# Patient Record
Sex: Female | Born: 1995 | Race: Black or African American | Hispanic: No | Marital: Single | Smoking: Never smoker
Health system: Southern US, Community
[De-identification: ages and names within clinical notes are randomized; demographics above are authoritative.]

## PROBLEM LIST (undated history)

## (undated) ENCOUNTER — Inpatient Hospital Stay (HOSPITAL_COMMUNITY): Payer: Self-pay

## (undated) DIAGNOSIS — Z789 Other specified health status: Secondary | ICD-10-CM

## (undated) DIAGNOSIS — I10 Essential (primary) hypertension: Secondary | ICD-10-CM

## (undated) DIAGNOSIS — R87619 Unspecified abnormal cytological findings in specimens from cervix uteri: Secondary | ICD-10-CM

## (undated) DIAGNOSIS — R87629 Unspecified abnormal cytological findings in specimens from vagina: Secondary | ICD-10-CM

## (undated) DIAGNOSIS — O1410 Severe pre-eclampsia, unspecified trimester: Secondary | ICD-10-CM

## (undated) DIAGNOSIS — J45909 Unspecified asthma, uncomplicated: Secondary | ICD-10-CM

## (undated) DIAGNOSIS — R519 Headache, unspecified: Secondary | ICD-10-CM

## (undated) HISTORY — DX: Unspecified asthma, uncomplicated: J45.909

## (undated) HISTORY — DX: Unspecified abnormal cytological findings in specimens from vagina: R87.629

## (undated) HISTORY — DX: Severe pre-eclampsia, unspecified trimester: O14.10

## (undated) HISTORY — DX: Headache, unspecified: R51.9

## (undated) HISTORY — DX: Unspecified abnormal cytological findings in specimens from cervix uteri: R87.619

## (undated) HISTORY — PX: NO PAST SURGERIES: SHX2092

---
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
## 2016-01-05 ENCOUNTER — Encounter (HOSPITAL_COMMUNITY): Payer: Self-pay | Admitting: *Deleted

## 2016-01-05 ENCOUNTER — Inpatient Hospital Stay (HOSPITAL_COMMUNITY)
Admission: AD | Admit: 2016-01-05 | Discharge: 2016-01-05 | Disposition: A | Discharge status: Home / Self Care | Payer: Medicaid Other | Source: Ambulatory Visit | Attending: Family Medicine | Admitting: Family Medicine

## 2016-01-05 DIAGNOSIS — O9989 Other specified diseases and conditions complicating pregnancy, childbirth and the puerperium: Secondary | ICD-10-CM

## 2016-01-05 DIAGNOSIS — R1084 Generalized abdominal pain: Secondary | ICD-10-CM | POA: Insufficient documentation

## 2016-01-05 DIAGNOSIS — R109 Unspecified abdominal pain: Secondary | ICD-10-CM | POA: Diagnosis present

## 2016-01-05 DIAGNOSIS — Z3A13 13 weeks gestation of pregnancy: Secondary | ICD-10-CM | POA: Diagnosis not present

## 2016-01-05 DIAGNOSIS — O26891 Other specified pregnancy related conditions, first trimester: Secondary | ICD-10-CM | POA: Insufficient documentation

## 2016-01-05 DIAGNOSIS — O26899 Other specified pregnancy related conditions, unspecified trimester: Secondary | ICD-10-CM

## 2016-01-05 LAB — URINALYSIS, ROUTINE W REFLEX MICROSCOPIC
BILIRUBIN URINE: NEGATIVE
Glucose, UA: NEGATIVE mg/dL
HGB URINE DIPSTICK: NEGATIVE
KETONES UR: NEGATIVE mg/dL
Nitrite: NEGATIVE
PH: 8.5 — AB (ref 5.0–8.0)
Protein, ur: NEGATIVE mg/dL
SPECIFIC GRAVITY, URINE: 1.02 (ref 1.005–1.030)

## 2016-01-05 LAB — WET PREP, GENITAL
Sperm: NONE SEEN
TRICH WET PREP: NONE SEEN
YEAST WET PREP: NONE SEEN

## 2016-01-05 LAB — OB RESULTS CONSOLE GC/CHLAMYDIA
Chlamydia: NEGATIVE
Gonorrhea: NEGATIVE

## 2016-01-05 LAB — URINE MICROSCOPIC-ADD ON: RBC / HPF: NONE SEEN RBC/hpf (ref 0–5)

## 2016-01-05 NOTE — Discharge Instructions (Signed)
Take tylenol as needed for pain. Keep your appointment as scheduled for prenatal care. Return here as needed.

## 2016-01-05 NOTE — MAU Provider Note (Signed)
CSN: [id number]     Arrival date & time 01/05/16  1159 History   None    Chief Complaint  Patient presents with  . Abdominal Pain     (Consider location/radiation/quality/duration/timing/severity/associated sxs/prior Treatment) Abdominal Pain This is a new problem. The current episode started in the past 7 days. The onset quality is gradual. The problem occurs intermittently. The problem has been unchanged. The pain is located in the generalized abdominal region. The pain is at a severity of 7/10. The quality of the pain is sharp. Associated symptoms include nausea. Pertinent negatives include no constipation, diarrhea, dysuria, fever, frequency, hematuria or vomiting. The pain is relieved by nothing. She has tried nothing for the symptoms.   Deborah Wells is a 20 y.o. G1P0 @ [redacted]w[redacted]d gestation who presents to the MAU with generalized abdominal pain that started 3 days ago. She reports the pain is sharp and comes and goes. She has taken nothing for the pain. Patient denies vaginal bleeding or d/c, she denies UTI symptoms.   Past Medical History  Diagnosis Date  . Medical history non-contributory    Past Surgical History  Procedure Laterality Date  . No past surgeries     Family History  Problem Relation Age of Onset  . Diabetes Mother   . Cancer Maternal Grandmother    Social History  Substance Use Topics  . Smoking status: Never Smoker   . Smokeless tobacco: Never Used  . Alcohol Use: No   OB History    Gravida Para Term Preterm AB TAB SAB Ectopic Multiple Living   1              Review of Systems  Constitutional: Negative for fever and chills.  HENT: Negative.   Eyes: Negative for visual disturbance.  Respiratory: Negative for cough, chest tightness and shortness of breath.   Cardiovascular: Negative for chest pain and palpitations.  Gastrointestinal: Positive for nausea and abdominal pain. Negative for vomiting, diarrhea and constipation.  Genitourinary: Negative  for dysuria, frequency, hematuria, flank pain, decreased urine volume, vaginal bleeding and vaginal discharge.  Musculoskeletal: Negative for back pain and joint swelling.  Skin: Negative for rash.  Psychiatric/Behavioral: Negative for confusion. The patient is not nervous/anxious.       Allergies  Review of patient's allergies indicates no known allergies.  Home Medications   Prior to Admission medications   Medication Sig Start Date End Date Taking? Authorizing Provider  metroNIDAZOLE (FLAGYL) 500 MG tablet Take 1 tablet (500 mg total) by mouth 2 (two) times daily. Patient not taking: Reported on 01/05/2016 11/15/15   Verita Schneiders Key, NP   BP 129/71 mmHg  Pulse 104  Temp(Src) 98.4 F (36.9 C) (Oral)  Resp 18  Wt 117 lb 0.6 oz (53.089 kg)  LMP 10/01/2015 Physical Exam  Constitutional: She is oriented to person, place, and time. She appears well-developed and well-nourished.  HENT:  Head: Normocephalic and atraumatic.  Eyes: Conjunctivae and EOM are normal.  Neck: Normal range of motion. Neck supple.  Cardiovascular: Normal rate and regular rhythm.   Pulmonary/Chest: Effort normal and breath sounds normal.  Abdominal: Soft. Bowel sounds are normal. There is no CVA tenderness.  FHT 150, pain not with palpation just sharp that comes and goes.   Genitourinary:  External genitalia without lesions, white d/c vaginal vault, Cervix long, closed, no CMT, no adnexal tenderness, uterus approximately 13 week size.   Musculoskeletal: Normal range of motion.  Neurological: She is alert and oriented to person, place, and  time. No cranial nerve deficit.  Skin: Skin is warm and dry.  Psychiatric: She has a normal mood and affect. Her behavior is normal.  Nursing note and vitals reviewed.   ED Course  Procedures (including critical care time) Labs Review Labs Reviewed  URINALYSIS, ROUTINE W REFLEX MICROSCOPIC (NOT AT [facility]) - Abnormal; Notable for the following:    APPearance CLOUDY (*)     pH 8.5 (*)    Leukocytes, UA TRACE (*)    All other components within normal limits  URINE MICROSCOPIC-ADD ON - Abnormal; Notable for the following:    Squamous Epithelial / LPF 6-30 (*)    Bacteria, UA FEW (*)    All other components within normal limits  WET PREP, GENITAL  GC/CHLAMYDIA PROBE AMP ([place]) NOT AT [facility]    Imaging Review No results found. I have personally reviewed and evaluated these images and lab results as part of my medical decision-making.   MDM  20 y.o. G1P0 @ 6877w0d gestation with abdominal pain that comes and goes stable for d/c without acute abdomen. Will treat for round ligament pain and she [facility]. She will return to MAU as needed for worsening symptoms. Tylenol for pain.   Final diagnoses:  Abdominal pain in pregnancy

## 2016-01-05 NOTE — MAU Note (Signed)
Sharp pains all over stomach. Started 3 days ago, comes and goes. Denies vomiting, diarrhea or constipation. Denies urinary symptoms.

## 2016-01-05 NOTE — Progress Notes (Signed)
Written and verbal d/c instructions given and understanding voiced. 

## 2016-01-07 LAB — GC/CHLAMYDIA PROBE AMP (~~LOC~~) NOT AT ARMC
Chlamydia: NEGATIVE
NEISSERIA GONORRHEA: NEGATIVE

## 2016-01-21 ENCOUNTER — Ambulatory Visit (INDEPENDENT_AMBULATORY_CARE_PROVIDER_SITE_OTHER): Payer: Medicaid Other | Admitting: Obstetrics & Gynecology

## 2016-01-21 ENCOUNTER — Encounter: Payer: Self-pay | Admitting: Obstetrics & Gynecology

## 2016-01-21 VITALS — BP 91/65 | HR 77 | Wt 117.4 lb

## 2016-01-21 DIAGNOSIS — Z3492 Encounter for supervision of normal pregnancy, unspecified, second trimester: Secondary | ICD-10-CM | POA: Diagnosis present

## 2016-01-21 DIAGNOSIS — Z98891 History of uterine scar from previous surgery: Secondary | ICD-10-CM | POA: Insufficient documentation

## 2016-01-21 DIAGNOSIS — Z3490 Encounter for supervision of normal pregnancy, unspecified, unspecified trimester: Secondary | ICD-10-CM

## 2016-01-21 DIAGNOSIS — Z3402 Encounter for supervision of normal first pregnancy, second trimester: Secondary | ICD-10-CM

## 2016-01-21 LAB — POCT URINALYSIS DIP (DEVICE)
Bilirubin Urine: NEGATIVE
Glucose, UA: NEGATIVE mg/dL
HGB URINE DIPSTICK: NEGATIVE
Ketones, ur: NEGATIVE mg/dL
NITRITE: NEGATIVE
PH: 7 (ref 5.0–8.0)
PROTEIN: NEGATIVE mg/dL
Specific Gravity, Urine: 1.02 (ref 1.005–1.030)
UROBILINOGEN UA: 2 mg/dL — AB (ref 0.0–1.0)

## 2016-01-21 NOTE — Patient Instructions (Signed)

## 2016-01-21 NOTE — Progress Notes (Signed)
   Subjective:    Deborah CroftBrooklyn D Wells is a G1P0 5745w2d being seen today for her first obstetrical visit.  Her obstetrical history is significant for teen pregnancy. Patient does intend to breast feed. Pregnancy history fully reviewed.  Patient reports no complaints.  Filed Vitals:   01/21/16 1559  BP: 91/65  Pulse: 77  Weight: 117 lb 6.4 oz (53.252 kg)    HISTORY: OB History  Gravida Para Term Preterm AB SAB TAB Ectopic Multiple Living  1             # Outcome Date GA Lbr Len/2nd Weight Sex Delivery Anes PTL Lv  1 Current              Past Medical History  Diagnosis Date  . Medical history non-contributory   . Asthma    Past Surgical History  Procedure Laterality Date  . No past surgeries     Family History  Problem Relation Age of Onset  . Diabetes Mother   . Cancer Maternal Grandmother      Exam    Uterus:     Pelvic Exam:    Perineum: No Hemorrhoids   Vulva: normal   Vagina:      pH:     Cervix: no lesions   Adnexa: not evaluated   Bony Pelvis: average  System: Breast:  normal appearance, no masses or tenderness   Skin: normal coloration and turgor, no rashes    Neurologic: oriented, normal mood   Extremities: normal strength, tone, and muscle mass   HEENT PERRLA and sclera clear, anicteric   Mouth/Teeth dental hygiene good   Neck supple   Cardiovascular: regular rate and rhythm   Respiratory:  appears well, vitals normal, no respiratory distress, acyanotic, normal RR   Abdomen: soft, non-tender; bowel sounds normal; no masses,  no organomegaly   Urinary: urethral meatus normal      Assessment:    Pregnancy: G1P0 There are no active problems to display for this patient.       Plan:     Initial labs drawn. Prenatal vitamins. Problem list reviewed and updated. Genetic Screening discussed Quad Screen: requested.  Ultrasound discussed; fetal survey: ordered.  Follow up in 4 weeks. 50% of 30 min visit spent on counseling and coordination  of care.     Frazier Balfour 01/21/2016

## 2016-01-21 NOTE — Progress Notes (Signed)
Here for initial prenatal visit. Urinalysis shows moderate leukocytes. Given new patient education booklets.

## 2016-01-22 LAB — PRENATAL PROFILE (SOLSTAS)
Antibody Screen: NEGATIVE
BASOS PCT: 1 %
Basophils Absolute: 77 cells/uL (ref 0–200)
EOS ABS: 77 {cells}/uL (ref 15–500)
Eosinophils Relative: 1 %
HCT: 35.7 % (ref 35.0–45.0)
HIV: NONREACTIVE
Hemoglobin: 11.9 g/dL (ref 11.7–15.5)
Hepatitis B Surface Ag: NEGATIVE
Lymphocytes Relative: 26 %
Lymphs Abs: 2002 cells/uL (ref 850–3900)
MCH: 28.2 pg (ref 27.0–33.0)
MCHC: 33.3 g/dL (ref 32.0–36.0)
MCV: 84.6 fL (ref 80.0–100.0)
MONO ABS: 693 {cells}/uL (ref 200–950)
MPV: 9.9 fL (ref 7.5–12.5)
Monocytes Relative: 9 %
NEUTROS ABS: 4851 {cells}/uL (ref 1500–7800)
Neutrophils Relative %: 63 %
PLATELETS: 314 10*3/uL (ref 140–400)
RBC: 4.22 MIL/uL (ref 3.80–5.10)
RDW: 13.7 % (ref 11.0–15.0)
RUBELLA: 1.95 {index} — AB (ref <0.90)
Rh Type: POSITIVE
WBC: 7.7 10*3/uL (ref 3.8–10.8)

## 2016-01-22 LAB — AFP, QUAD SCREEN
AFP: 30.6 ng/mL
Curr Gest Age: 15.3 weeks
HCG TOTAL: 49.87 [IU]/mL
INH: 203.5 pg/mL
Interpretation-AFP: NEGATIVE
MOM FOR AFP: 0.93
MOM FOR HCG: 0.92
MOM FOR INH: 1
OPEN SPINA BIFIDA: NEGATIVE
Osb Risk: 1:17000 {titer}
Tri 18 Scr Risk Est: NEGATIVE
UE3 MOM: 0.92
uE3 Value: 0.65 ng/mL

## 2016-01-23 LAB — CULTURE, OB URINE: Colony Count: 75000

## 2016-01-26 LAB — PAIN MGMT, PROFILE 6 CONF W/O MM, U
6 Acetylmorphine: NEGATIVE ng/mL (ref <10)
ALCOHOL METABOLITES: NEGATIVE ng/mL (ref <500)
AMPHETAMINES: NEGATIVE ng/mL (ref <500)
Barbiturates: NEGATIVE ng/mL (ref <300)
Benzodiazepines: NEGATIVE ng/mL (ref <100)
COCAINE METABOLITE: NEGATIVE ng/mL (ref <150)
CREATININE: 147.2 mg/dL (ref >=20.0)
METHADONE METABOLITE: NEGATIVE ng/mL (ref <100)
Marijuana Metabolite: NEGATIVE ng/mL (ref <20)
Opiates: NEGATIVE ng/mL (ref <100)
Oxidant: POSITIVE ug/mL — AB (ref <200)
Oxycodone: NEGATIVE ng/mL (ref <100)
PH: 7.5 (ref 4.5–9.0)
Phencyclidine: NEGATIVE ng/mL (ref <25)
Please note:: 0

## 2016-02-10 ENCOUNTER — Encounter (HOSPITAL_COMMUNITY): Payer: Self-pay | Admitting: Obstetrics & Gynecology

## 2016-02-17 ENCOUNTER — Ambulatory Visit (HOSPITAL_COMMUNITY)
Admission: RE | Admit: 2016-02-17 | Discharge: 2016-02-17 | Disposition: A | Discharge status: Home / Self Care | Payer: Medicaid Other | Source: Ambulatory Visit | Attending: Obstetrics & Gynecology | Admitting: Obstetrics & Gynecology

## 2016-02-17 ENCOUNTER — Encounter (HOSPITAL_COMMUNITY): Payer: Self-pay

## 2016-02-17 ENCOUNTER — Other Ambulatory Visit: Payer: Self-pay | Admitting: Obstetrics & Gynecology

## 2016-02-17 DIAGNOSIS — Z36 Encounter for antenatal screening of mother: Secondary | ICD-10-CM | POA: Diagnosis not present

## 2016-02-17 DIAGNOSIS — Z1389 Encounter for screening for other disorder: Secondary | ICD-10-CM

## 2016-02-17 DIAGNOSIS — Z3490 Encounter for supervision of normal pregnancy, unspecified, unspecified trimester: Secondary | ICD-10-CM

## 2016-02-17 DIAGNOSIS — Z3A18 18 weeks gestation of pregnancy: Secondary | ICD-10-CM | POA: Insufficient documentation

## 2016-02-22 ENCOUNTER — Encounter: Payer: Medicaid Other | Admitting: Obstetrics & Gynecology

## 2016-03-23 ENCOUNTER — Ambulatory Visit (INDEPENDENT_AMBULATORY_CARE_PROVIDER_SITE_OTHER): Payer: Medicaid Other | Admitting: Student

## 2016-03-23 ENCOUNTER — Encounter: Payer: Self-pay | Admitting: Student

## 2016-03-23 VITALS — BP 133/86 | HR 86 | Wt 132.0 lb

## 2016-03-23 DIAGNOSIS — Z3402 Encounter for supervision of normal first pregnancy, second trimester: Secondary | ICD-10-CM

## 2016-03-23 LAB — POCT URINALYSIS DIP (DEVICE)
BILIRUBIN URINE: NEGATIVE
GLUCOSE, UA: NEGATIVE mg/dL
Hgb urine dipstick: NEGATIVE
KETONES UR: NEGATIVE mg/dL
Nitrite: NEGATIVE
Protein, ur: NEGATIVE mg/dL
SPECIFIC GRAVITY, URINE: 1.015 (ref 1.005–1.030)
Urobilinogen, UA: 1 mg/dL (ref 0.0–1.0)
pH: 7 (ref 5.0–8.0)

## 2016-03-23 NOTE — Progress Notes (Signed)
   PRENATAL VISIT NOTE  Subjective:  Deborah Wells is a 20 y.o. G1P0 at 4553w1d being seen today for ongoing prenatal care.  She is currently monitored for the following issues for this low-risk pregnancy and has Supervision of normal first pregnancy, antepartum on her problem list.  Patient reports no complaints.   .  .  Movement: Present. Denies leaking of fluid.   The following portions of the patient's history were reviewed and updated as appropriate: allergies, current medications, past family history, past medical history, past social history, past surgical history and problem list. Problem list updated.  Objective:   Vitals:   03/23/16 1038  BP: 133/86  Pulse: 86  Weight: 132 lb (59.9 kg)    Fetal Status: Fetal Heart Rate (bpm): 152   Movement: Present   Fundal height 21 cm  General:  Alert, oriented and cooperative. Patient is in no acute distress.  Skin: Skin is warm and dry. No rash noted.   Cardiovascular: Normal heart rate noted  Respiratory: Normal respiratory effort, no problems with respiration noted  Abdomen: Soft, gravid, appropriate for gestational age. Pain/Pressure: Absent     Pelvic:  Cervical exam deferred        Extremities: Normal range of motion.  Edema: None  Mental Status: Normal mood and affect. Normal behavior. Normal judgment and thought content.   Urinalysis:      Assessment and Plan:  Pregnancy: G1P0 at 6253w1d  1. Supervision of normal first pregnancy, antepartum, second trimester -f/u ultrasound to complete anatomy - US MFM OB FOLLOW UP; Future  Preterm labor symptoms and general obstetric precautions including but not limited to vaginal bleeding, contractions, leaking of fluid and fetal movement were reviewed in detail with the patient. Please refer to After Visit Summary for other counseling recommendations.  Return in about 4 weeks (around 04/20/2016) for Routine OB.  Judeth HornErin Jesly Hartmann, NP

## 2016-03-23 NOTE — Patient Instructions (Signed)

## 2016-03-30 ENCOUNTER — Ambulatory Visit (HOSPITAL_COMMUNITY)
Admission: RE | Admit: 2016-03-30 | Discharge: 2016-03-30 | Disposition: A | Discharge status: Home / Self Care | Payer: Medicaid Other | Source: Ambulatory Visit | Attending: Student | Admitting: Student

## 2016-03-30 DIAGNOSIS — Z36 Encounter for antenatal screening of mother: Secondary | ICD-10-CM | POA: Insufficient documentation

## 2016-03-30 DIAGNOSIS — Z3A24 24 weeks gestation of pregnancy: Secondary | ICD-10-CM | POA: Insufficient documentation

## 2016-03-30 DIAGNOSIS — Z3402 Encounter for supervision of normal first pregnancy, second trimester: Secondary | ICD-10-CM

## 2016-04-20 ENCOUNTER — Ambulatory Visit (INDEPENDENT_AMBULATORY_CARE_PROVIDER_SITE_OTHER): Payer: Medicaid Other | Admitting: Obstetrics and Gynecology

## 2016-04-20 VITALS — BP 113/85 | HR 82 | Wt 134.2 lb

## 2016-04-20 DIAGNOSIS — Z3400 Encounter for supervision of normal first pregnancy, unspecified trimester: Secondary | ICD-10-CM

## 2016-04-20 DIAGNOSIS — Z3403 Encounter for supervision of normal first pregnancy, third trimester: Secondary | ICD-10-CM

## 2016-04-20 NOTE — Progress Notes (Signed)
Subjective:  Deborah CroftBrooklyn D Wells is a 20 y.o. G1P0 at 2823w1d being seen today for ongoing prenatal care.  She is currently monitored for the following issues for this low-risk pregnancy and has Supervision of normal first pregnancy, antepartum on her problem list.  Patient reports no complaints.  Contractions: Not present. Vag. Bleeding: None.  Movement: Present. Denies leaking of fluid.   The following portions of the patient's history were reviewed and updated as appropriate: allergies, current medications, past family history, past medical history, past social history, past surgical history and problem list. Problem list updated.  Objective:   Vitals:   04/20/16 1302  BP: 113/85  Pulse: 82  Weight: 134 lb 3.2 oz (60.9 kg)    Fetal Status: Fetal Heart Rate (bpm): 149   Movement: Present     General:  Alert, oriented and cooperative. Patient is in no acute distress.  Skin: Skin is warm and dry. No rash noted.   Cardiovascular: Normal heart rate noted  Respiratory: Normal respiratory effort, no problems with respiration noted  Abdomen: Soft, gravid, appropriate for gestational age. Pain/Pressure: Absent     Pelvic:  Cervical exam deferred        Extremities: Normal range of motion.  Edema: None  Mental Status: Normal mood and affect. Normal behavior. Normal judgment and thought content.   Urinalysis:      Assessment and Plan:  Pregnancy: G1P0 at 3923w1d  1. Supervision of normal first pregnancy, antepartum Declined flu vaccine Unable to stay for 28 week labs Stressed importance of labs To RTC this week for labs  Preterm labor symptoms and general obstetric precautions including but not limited to vaginal bleeding, contractions, leaking of fluid and fetal movement were reviewed in detail with the patient. Please refer to After Visit Summary for other counseling recommendations.  Return in about 2 weeks (around 05/04/2016) for OB visit.   Hermina StaggersMichael L Cherica Heiden, MD

## 2016-04-20 NOTE — Progress Notes (Signed)
Pt unable to wait for her 28 week labs today. Will schedule time to come back.

## 2016-04-22 ENCOUNTER — Other Ambulatory Visit: Payer: Medicaid Other

## 2016-04-27 ENCOUNTER — Other Ambulatory Visit: Payer: Medicaid Other

## 2016-05-11 ENCOUNTER — Ambulatory Visit (INDEPENDENT_AMBULATORY_CARE_PROVIDER_SITE_OTHER): Payer: Medicaid Other | Admitting: Obstetrics & Gynecology

## 2016-05-11 VITALS — BP 136/85 | HR 108 | Wt 139.2 lb

## 2016-05-11 DIAGNOSIS — Z3400 Encounter for supervision of normal first pregnancy, unspecified trimester: Secondary | ICD-10-CM

## 2016-05-11 DIAGNOSIS — O26843 Uterine size-date discrepancy, third trimester: Secondary | ICD-10-CM

## 2016-05-11 NOTE — Progress Notes (Signed)
States cannot do glucola today- can not drink whole bottle. Is agreeable to coming later this week for jelly bean glucose test. Declines flu and tdap.

## 2016-05-11 NOTE — Addendum Note (Signed)
Addended by: Jaynie CollinsANYANWU, Aara Jacquot A on: 05/11/2016 04:27 PM   Modules accepted: Orders

## 2016-05-11 NOTE — Progress Notes (Signed)
   PRENATAL VISIT NOTE  Subjective:  Deborah Wells is a 20 y.o. G1P0 at 647w1d being seen today for ongoing prenatal care.  She is currently monitored for the following issues for this low-risk pregnancy and has Supervision of normal first pregnancy, antepartum on her problem list.  Patient reports no complaints.  Contractions: Not present. Vag. Bleeding: None.  Movement: Present. Denies leaking of fluid.   The following portions of the patient's history were reviewed and updated as appropriate: allergies, current medications, past family history, past medical history, past social history, past surgical history and problem list. Problem list updated.  Objective:   Vitals:   05/11/16 1613  BP: 136/85  Pulse: (!) 108  Weight: 139 lb 3.2 oz (63.1 kg)    Fetal Status: Fetal Heart Rate (bpm): 138 Fundal Height: 27 cm Movement: Present     General:  Alert, oriented and cooperative. Patient is in no acute distress.  Skin: Skin is warm and dry. No rash noted.   Cardiovascular: Normal heart rate noted  Respiratory: Normal respiratory effort, no problems with respiration noted  Abdomen: Soft, gravid, appropriate for gestational age. Pain/Pressure: Present     Pelvic:  Cervical exam deferred        Extremities: Normal range of motion.  Edema: None  Mental Status: Normal mood and affect. Normal behavior. Normal judgment and thought content.   Assessment and Plan:  Pregnancy: G1P0 at 3147w1d  1. Fundal height low for dates in third trimester Will follow up results - US MFM OB FOLLOW UP; Future  2. Supervision of normal first pregnancy, antepartum Preterm labor symptoms and general obstetric precautions including but not limited to vaginal bleeding, contractions, leaking of fluid and fetal movement were reviewed in detail with the patient. Please refer to After Visit Summary for other counseling recommendations.  Return in about 2 days (around 05/13/2016) for 1 hr GTT.  2 weeks - OB  visit.  Tereso NewcomerUgonna A Beverely Suen, MD

## 2016-05-13 ENCOUNTER — Other Ambulatory Visit: Payer: Medicaid Other

## 2016-05-17 ENCOUNTER — Other Ambulatory Visit: Payer: Medicaid Other

## 2016-05-17 DIAGNOSIS — Z3400 Encounter for supervision of normal first pregnancy, unspecified trimester: Secondary | ICD-10-CM

## 2016-05-18 LAB — GLUCOSE TOLERANCE, 1 HOUR (50G) W/O FASTING: GLUCOSE, 1 HR, GESTATIONAL: 130 mg/dL (ref <140)

## 2016-05-19 ENCOUNTER — Ambulatory Visit (HOSPITAL_COMMUNITY): Payer: Medicaid Other | Attending: Obstetrics & Gynecology

## 2016-05-30 ENCOUNTER — Ambulatory Visit (HOSPITAL_COMMUNITY)
Admission: RE | Admit: 2016-05-30 | Discharge: 2016-05-30 | Disposition: A | Discharge status: Home / Self Care | Payer: Medicaid Other | Source: Ambulatory Visit | Attending: Obstetrics & Gynecology | Admitting: Obstetrics & Gynecology

## 2016-05-30 ENCOUNTER — Other Ambulatory Visit: Payer: Self-pay | Admitting: Obstetrics & Gynecology

## 2016-05-30 DIAGNOSIS — Z3A33 33 weeks gestation of pregnancy: Secondary | ICD-10-CM

## 2016-05-30 DIAGNOSIS — O365930 Maternal care for other known or suspected poor fetal growth, third trimester, not applicable or unspecified: Secondary | ICD-10-CM | POA: Diagnosis present

## 2016-05-30 DIAGNOSIS — O365920 Maternal care for other known or suspected poor fetal growth, second trimester, not applicable or unspecified: Secondary | ICD-10-CM | POA: Diagnosis not present

## 2016-05-30 DIAGNOSIS — O26843 Uterine size-date discrepancy, third trimester: Secondary | ICD-10-CM

## 2016-05-30 IMAGING — US US MFM OB FOLLOW-UP
1 series · 14 of 28 positions shown · non-contrast
Comparison: none

[Series 1: us mfm ob follow-up · 50 acquisitions, 14 frames shown]
[im 2/50]
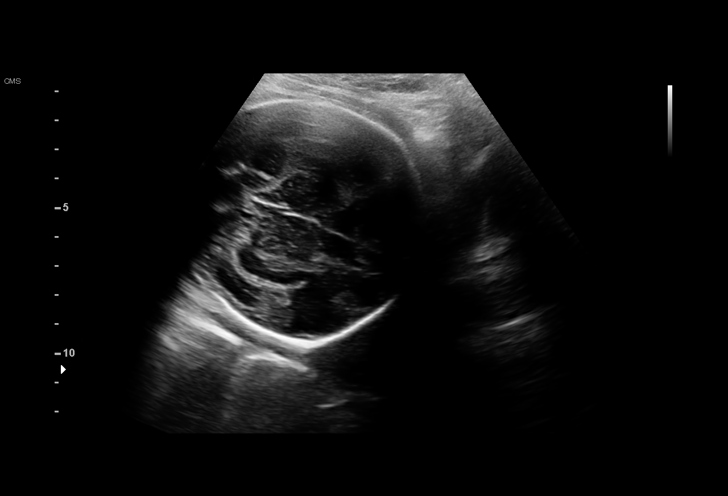
[im 6/50]
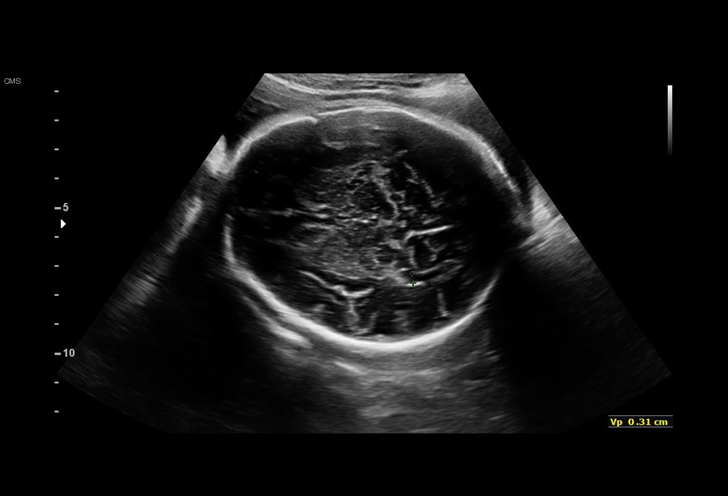
[im 10/50]
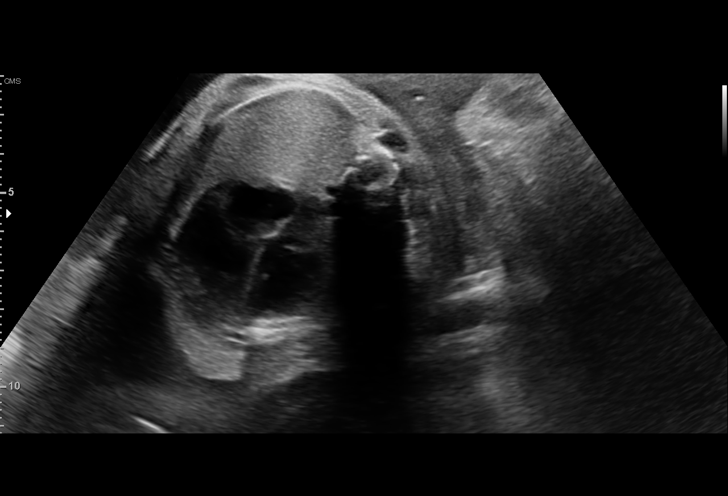
[im 13/50]
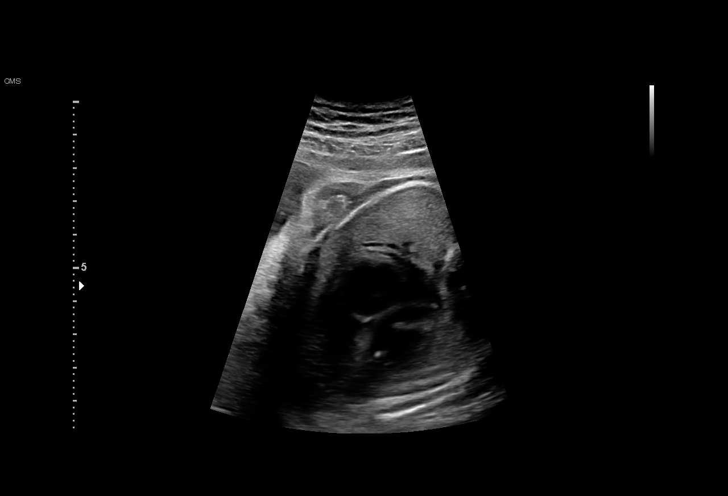
[im 17/50]
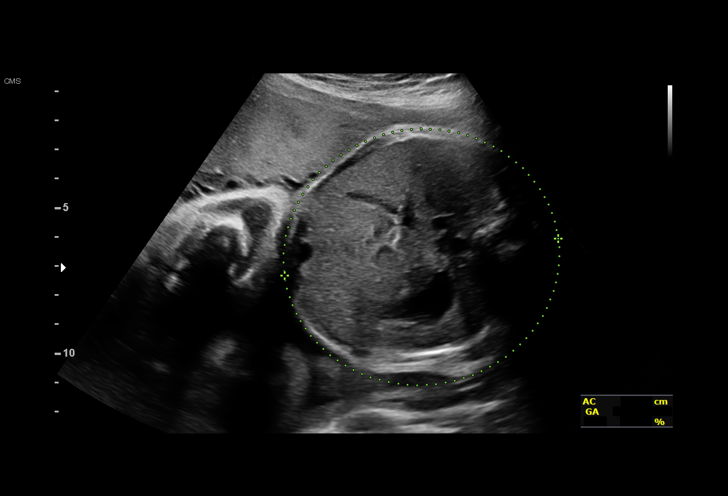
[im 20/50]
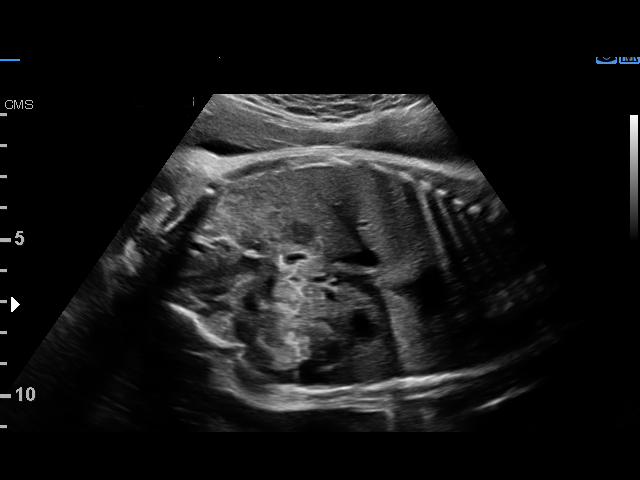
[im 24/50]
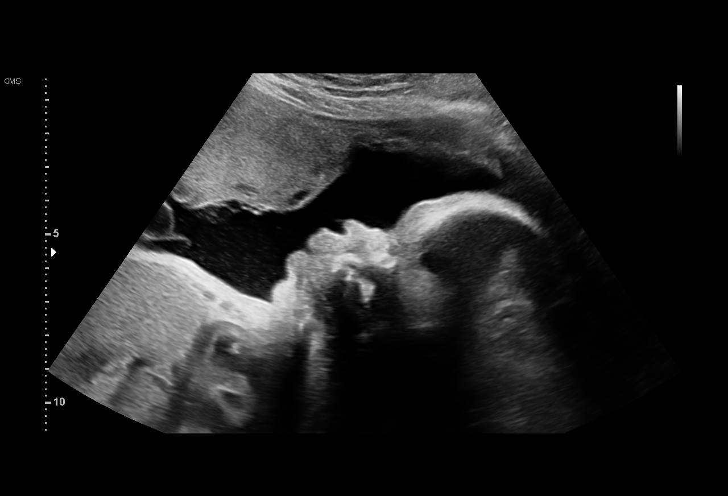
[im 28/50]
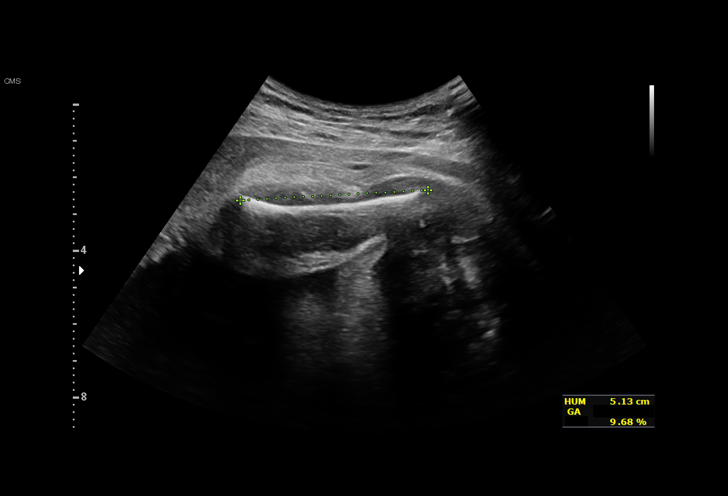
[im 31/50]
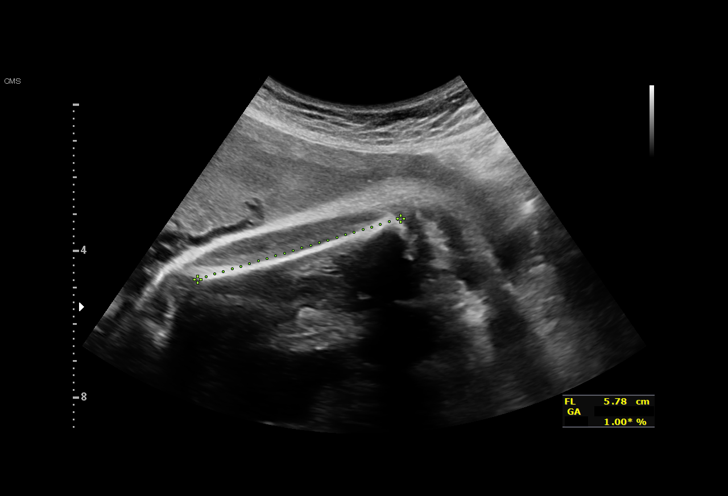
[im 35/50]
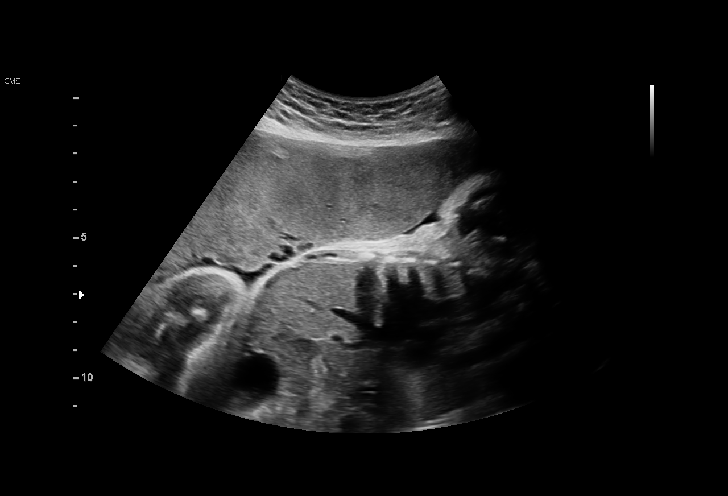
[im 39/50]
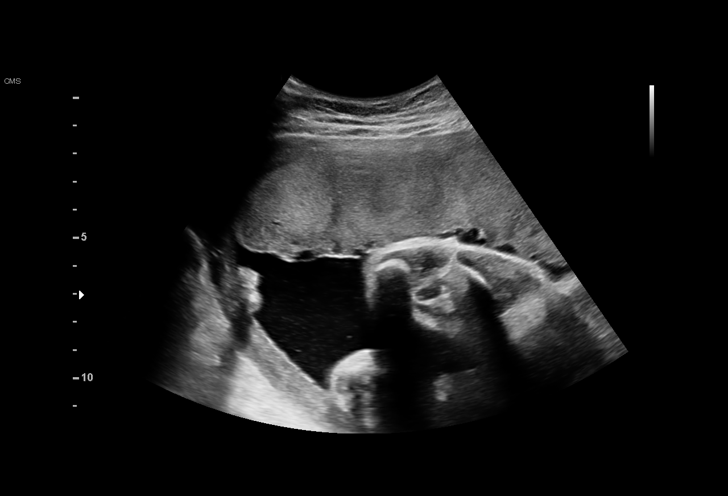
[im 42/50]
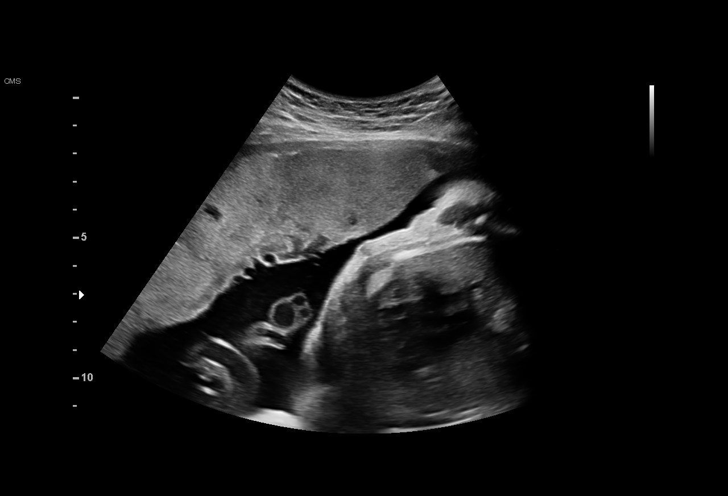
[im 46/50]
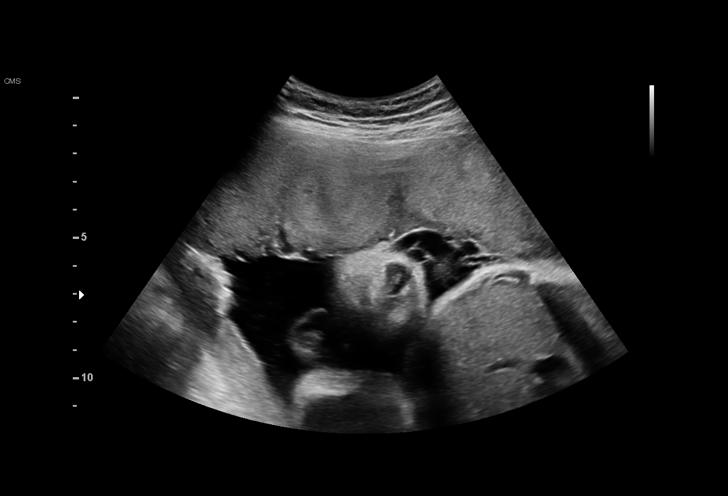
[im 50/50]
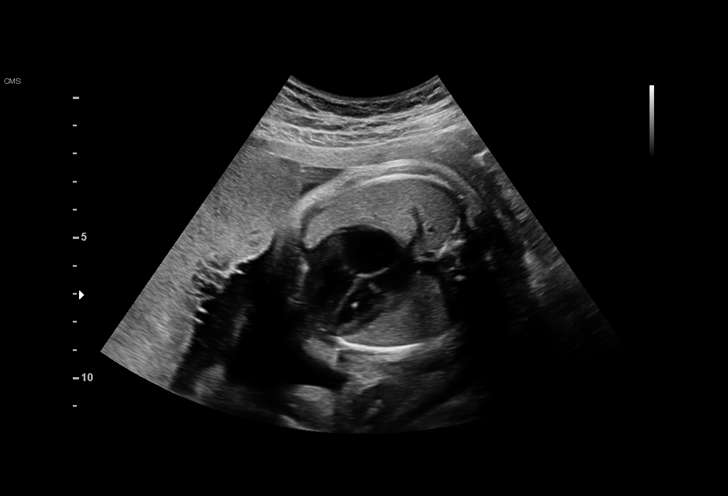

[14 of 28 positions shown; findings below may reference images not displayed]

OB/Gyn Clinic
[REDACTED]

1  LUNDQUIST           [PHONE_NUMBER]      [PHONE_NUMBER]     [PHONE_NUMBER]
Indications

33 weeks gestation of pregnancy
Small for gestational age fetus affecting      [DT]
management of mother
OB History

Gravidity:    1         Term:   0        Prem:   0        SAB:   0
TOP:          0       Ectopic:  0        Living: 0
Fetal Evaluation

Num Of Fetuses:     1
Fetal Heart         134
Rate(bpm):
Cardiac Activity:   Observed
Presentation:       Cephalic

AFI Sum(cm)     %Tile       Largest Pocket(cm)
14.62           51

RUQ(cm)       RLQ(cm)       LUQ(cm)        LLQ(cm)
3.48
Biometry

BPD:      81.6  mm     G. Age:  32w 6d         37  %    CI:        76.13   %    70 - 86
FL/HC:      19.2   %    19.9 -
HC:      296.4  mm     G. Age:  32w 5d         12  %    HC/AC:      1.05        0.96 -
AC:      283.2  mm     G. Age:  32w 2d         32  %    FL/BPD:     69.9   %    71 - 87
FL:         57  mm     G. Age:  29w 6d        < 3  %    FL/AC:      20.1   %    20 - 24
HUM:      50.5  mm     G. Age:  29w 4d        < 5  %

Est. FW:    [DT]  gm           4 lb     34  %
Gestational Age

LMP:           34w 4d        Date:  [DATE]                 EDD:   [DATE]
U/S Today:     32w 0d                                        EDD:   [DATE]
Best:          33w 0d     Det. By:  U/S  ([DATE])          EDD:   [DATE]
Anatomy

Cranium:               Previously seen        Aortic Arch:            Previously seen
Cavum:                 Previously seen        Ductal Arch:            Previously seen
Ventricles:            Previously seen        Diaphragm:              Appears normal
Choroid Plexus:        Previously seen        Stomach:                Appears normal, left
sided
Cerebellum:            Previously seen        Abdomen:                Previously seen
Posterior Fossa:       Previously seen        Abdominal Wall:         Previously seen
Nuchal Fold:           Not applicable (>20    Cord Vessels:           Previously seen
wks GA)
Face:                  Orbits and profile     Kidneys:                Appear normal
previously seen
Lips:                  Previously seen        Bladder:                Appears normal
Thoracic:              Previously seen        Spine:                  Previously seen
Heart:                 Echogenic focus        Upper Extremities:      Previously seen
in LV
RVOT:                  Previously seen        Lower Extremities:      Previously seen
LVOT:                  Previously seen

Other:  Fetus appears to be a female. Nasal bone visualized. Heels
previously seen. Technically difficult due to fetal position.
Cervix Uterus Adnexa

Cervix
Not visualized (advanced GA >[DT])

Adnexa:       Not visualized
Impression

Singleton intrauterine pregnancy at 33+0 weeks with
size<dates
Review of the anatomy shows no sonographic markers for
aneuploidy or structural anomalies; previously noted
nonpathologic echogenic intracardiac foci are still present.
Amniotic fluid volume is normal with an AFI of 14.6 cm
Estimated fetal weight is 1819g which is growth in the 34th
percentile. The abdominal circumfrence is in the 32nd
percentile
Recommendations

No evidence of IUGR or fetal compromise. Follow-up
ultrasounds as clinically indicated.

## 2016-05-31 ENCOUNTER — Encounter: Payer: Medicaid Other | Admitting: Medical

## 2016-05-31 ENCOUNTER — Encounter: Payer: Self-pay | Admitting: Family Medicine

## 2016-06-15 ENCOUNTER — Other Ambulatory Visit (HOSPITAL_COMMUNITY)
Admission: RE | Admit: 2016-06-15 | Discharge: 2016-06-15 | Disposition: A | Discharge status: Home / Self Care | Payer: Medicaid Other | Source: Ambulatory Visit | Attending: Family Medicine | Admitting: Family Medicine

## 2016-06-15 ENCOUNTER — Ambulatory Visit (INDEPENDENT_AMBULATORY_CARE_PROVIDER_SITE_OTHER): Payer: Medicaid Other | Admitting: Family Medicine

## 2016-06-15 VITALS — BP 142/86 | HR 88 | Wt 148.2 lb

## 2016-06-15 DIAGNOSIS — Z113 Encounter for screening for infections with a predominantly sexual mode of transmission: Secondary | ICD-10-CM | POA: Insufficient documentation

## 2016-06-15 DIAGNOSIS — Z23 Encounter for immunization: Secondary | ICD-10-CM | POA: Diagnosis present

## 2016-06-15 DIAGNOSIS — Z3403 Encounter for supervision of normal first pregnancy, third trimester: Secondary | ICD-10-CM

## 2016-06-15 DIAGNOSIS — O163 Unspecified maternal hypertension, third trimester: Secondary | ICD-10-CM

## 2016-06-15 DIAGNOSIS — Z3400 Encounter for supervision of normal first pregnancy, unspecified trimester: Secondary | ICD-10-CM

## 2016-06-15 DIAGNOSIS — O133 Gestational [pregnancy-induced] hypertension without significant proteinuria, third trimester: Secondary | ICD-10-CM

## 2016-06-15 LAB — POCT URINALYSIS DIP (DEVICE)
BILIRUBIN URINE: NEGATIVE
GLUCOSE, UA: NEGATIVE mg/dL
KETONES UR: NEGATIVE mg/dL
Nitrite: NEGATIVE
Protein, ur: NEGATIVE mg/dL
SPECIFIC GRAVITY, URINE: 1.01 (ref 1.005–1.030)
Urobilinogen, UA: 0.2 mg/dL (ref 0.0–1.0)
pH: 6.5 (ref 5.0–8.0)

## 2016-06-15 LAB — COMPREHENSIVE METABOLIC PANEL
ALK PHOS: 170 U/L (ref 47–176)
ALT: 7 U/L (ref 5–32)
AST: 12 U/L (ref 12–32)
Albumin: 3.4 g/dL — ABNORMAL LOW (ref 3.6–5.1)
BUN: 7 mg/dL (ref 7–20)
CALCIUM: 9 mg/dL (ref 8.9–10.4)
CHLORIDE: 103 mmol/L (ref 98–110)
CO2: 23 mmol/L (ref 20–31)
Creat: 0.64 mg/dL (ref 0.50–1.00)
GLUCOSE: 72 mg/dL (ref 65–99)
POTASSIUM: 4.3 mmol/L (ref 3.8–5.1)
Sodium: 134 mmol/L — ABNORMAL LOW (ref 135–146)
Total Bilirubin: 0.4 mg/dL (ref 0.2–1.1)
Total Protein: 6.7 g/dL (ref 6.3–8.2)

## 2016-06-15 LAB — CBC
HCT: 34.5 % — ABNORMAL LOW (ref 35.0–45.0)
Hemoglobin: 11.2 g/dL — ABNORMAL LOW (ref 11.7–15.5)
MCH: 26.2 pg — AB (ref 27.0–33.0)
MCHC: 32.5 g/dL (ref 32.0–36.0)
MCV: 80.8 fL (ref 80.0–100.0)
MPV: 10.9 fL (ref 7.5–12.5)
PLATELETS: 342 10*3/uL (ref 140–400)
RBC: 4.27 MIL/uL (ref 3.80–5.10)
RDW: 14.5 % (ref 11.0–15.0)
WBC: 7.7 10*3/uL (ref 3.8–10.8)

## 2016-06-15 LAB — OB RESULTS CONSOLE GBS: GBS: NEGATIVE

## 2016-06-15 NOTE — Progress Notes (Signed)
   PRENATAL VISIT NOTE  Subjective:  Deborah Wells is a 20 y.o. G1P0 at 5856w1d being seen today for ongoing prenatal care.  She is currently monitored for the following issues for this low-risk pregnancy and has Supervision of normal first pregnancy, antepartum on her problem list.  Patient reports no complaints.  Contractions: Irritability. Vag. Bleeding: None.  Movement: Present. Denies leaking of fluid.   The following portions of the patient's history were reviewed and updated as appropriate: allergies, current medications, past family history, past medical history, past social history, past surgical history and problem list. Problem list updated.  Objective:   Vitals:   06/15/16 1516  BP: (!) 148/92  Pulse: 88  Weight: 148 lb 3.2 oz (67.2 kg)    Fetal Status: Fetal Heart Rate (bpm): 153 Fundal Height: 34 cm Movement: Present     General:  Alert, oriented and cooperative. Patient is in no acute distress.  Skin: Skin is warm and dry. No rash noted.   Cardiovascular: Normal heart rate noted  Respiratory: Normal respiratory effort, no problems with respiration noted  Abdomen: Soft, gravid, appropriate for gestational age. Pain/Pressure: Present     Pelvic:  Cervical exam deferred        Extremities: Normal range of motion.  Edema: None  Mental Status: Normal mood and affect. Normal behavior. Normal judgment and thought content.   Assessment and Plan:  Pregnancy: G1P0 at 1756w1d  1. Supervision of normal first pregnancy, antepartum Continue routine prenatal care. - HIV antibody (with reflex) - CBC - RPR - GC/Chlamydia probe amp ([place] - Culture, beta strep (group b only)  2. Need for Tdap vaccination  - Tdap vaccine greater than or equal to 7yo IM  3. Elevated blood pressure affecting pregnancy in third trimester, antepartum Watch BP if up again--consider IOL at 37 wks - Comprehensive metabolic panel - Protein / creatinine ratio, urine  Preterm  labor symptoms and general obstetric precautions including but not limited to vaginal bleeding, contractions, leaking of fluid and fetal movement were reviewed in detail with the patient. Please refer to After Visit Summary for other counseling recommendations.  Return in 1 week (on 06/22/2016).   Reva Boresanya S Pratt, MD

## 2016-06-15 NOTE — Progress Notes (Signed)
Cultures today 

## 2016-06-15 NOTE — Patient Instructions (Signed)
Third Trimester of Pregnancy The third trimester is from week 29 through week 40 (months 7 through 9). The third trimester is a time when the unborn baby (fetus) is growing rapidly. At the end of the ninth month, the fetus is about 20 inches in length and weighs 6-10 pounds. Body changes during your third trimester Your body goes through many changes during pregnancy. The changes vary from woman to woman. During the third trimester:  Your weight will continue to increase. You can expect to gain 25-35 pounds (11-16 kg) by the end of the pregnancy.  You may begin to get stretch marks on your hips, abdomen, and breasts.  You may urinate more often because the fetus is moving lower into your pelvis and pressing on your bladder.  You may develop or continue to have heartburn. This is caused by increased hormones that slow down muscles in the digestive tract.  You may develop or continue to have constipation because increased hormones slow digestion and cause the muscles that push waste through your intestines to relax.  You may develop hemorrhoids. These are swollen veins (varicose veins) in the rectum that can itch or be painful.  You may develop swollen, bulging veins (varicose veins) in your legs.  You may have increased body aches in the pelvis, back, or thighs. This is due to weight gain and increased hormones that are relaxing your joints.  You may have changes in your hair. These can include thickening of your hair, rapid growth, and changes in texture. Some women also have hair loss during or after pregnancy, or hair that feels dry or thin. Your hair will most likely return to normal after your baby is born.  Your breasts will continue to grow and they will continue to become tender. A yellow fluid (colostrum) may leak from your breasts. This is the first milk you are producing for your baby.  Your belly button may stick out.  You may notice more swelling in your hands, face, or  ankles.  You may have increased tingling or numbness in your hands, arms, and legs. The skin on your belly may also feel numb.  You may feel short of breath because of your expanding uterus.  You may have more problems sleeping. This can be caused by the size of your belly, increased need to urinate, and an increase in your body's metabolism.  You may notice the fetus "dropping," or moving lower in your abdomen.  You may have increased vaginal discharge.  Your cervix becomes thin and soft (effaced) near your due date. What to expect at prenatal visits You will have prenatal exams every 2 weeks until week 36. Then you will have weekly prenatal exams. During a routine prenatal visit:  You will be weighed to make sure you and the fetus are growing normally.  Your blood pressure will be taken.  Your abdomen will be measured to track your baby's growth.  The fetal heartbeat will be listened to.  Any test results from the previous visit will be discussed.  You may have a cervical check near your due date to see if you have effaced. At around 36 weeks, your health care provider will check your cervix. At the same time, your health care provider will also perform a test on the secretions of the vaginal tissue. This test is to determine if a type of bacteria, Group B streptococcus, is present. Your health care provider will explain this further. Your health care provider may ask you:    What your birth plan is.  How you are feeling.  If you are feeling the baby move.  If you have had any abnormal symptoms, such as leaking fluid, bleeding, severe headaches, or abdominal cramping.  If you are using any tobacco products, including cigarettes, chewing tobacco, and electronic cigarettes.  If you have any questions. Other tests or screenings that may be performed during your third trimester include:  Blood tests that check for low iron levels (anemia).  Fetal testing to check the health,  activity level, and growth of the fetus. Testing is done if you have certain medical conditions or if there are problems during the pregnancy.  Nonstress test (NST). This test checks the health of your baby to make sure there are no signs of problems, such as the baby not getting enough oxygen. During this test, a belt is placed around your belly. The baby is made to move, and its heart rate is monitored during movement. What is false labor? False labor is a condition in which you feel small, irregular tightenings of the muscles in the womb (contractions) that eventually go away. These are called Braxton Hicks contractions. Contractions may last for hours, days, or even weeks before true labor sets in. If contractions come at regular intervals, become more frequent, increase in intensity, or become painful, you should see your health care provider. What are the signs of labor?  Abdominal cramps.  Regular contractions that start at 10 minutes apart and become stronger and more frequent with time.  Contractions that start on the top of the uterus and spread down to the lower abdomen and back.  Increased pelvic pressure and dull back pain.  A watery or bloody mucus discharge that comes from the vagina.  Leaking of amniotic fluid. This is also known as your "water breaking." It could be a slow trickle or a gush. Let your doctor know if it has a color or strange odor. If you have any of these signs, call your health care provider right away, even if it is before your due date. Follow these instructions at home: Eating and drinking  Continue to eat regular, healthy meals.  Do not eat:  Raw meat or meat spreads.  Unpasteurized milk or cheese.  Unpasteurized juice.  Store-made salad.  Refrigerated smoked seafood.  Hot dogs or deli meat, unless they are piping hot.  More than 6 ounces of albacore tuna a week.  Shark, swordfish, king mackerel, or tile fish.  Store-made salads.  Raw  sprouts, such as mung bean or alfalfa sprouts.  Take prenatal vitamins as told by your health care provider.  Take 1000 mg of calcium daily as told by your health care provider.  If you develop constipation:  Take over-the-counter or prescription medicines.  Drink enough fluid to keep your urine clear or pale yellow.  Eat foods that are high in fiber, such as fresh fruits and vegetables, whole grains, and beans.  Limit foods that are high in fat and processed sugars, such as fried and sweet foods. Activity  Exercise only as directed by your health care provider. Healthy pregnant women should aim for 2 hours and 30 minutes of moderate exercise per week. If you experience any pain or discomfort while exercising, stop.  Avoid heavy lifting.  Do not exercise in extreme heat or humidity, or at high altitudes.  Wear low-heel, comfortable shoes.  Practice good posture.  Do not travel far distances unless it is absolutely necessary and only with the approval   of your health care provider.  Wear your seat belt at all times while in a car, on a bus, or on a plane.  Take frequent breaks and rest with your legs elevated if you have leg cramps or low back pain.  Do not use hot tubs, steam rooms, or saunas.  You may continue to have sex unless your health care provider tells you otherwise. Lifestyle  Do not use any products that contain nicotine or tobacco, such as cigarettes and e-cigarettes. If you need help quitting, ask your health care provider.  Do not drink alcohol.  Do not use any medicinal herbs or unprescribed drugs. These chemicals affect the formation and growth of the baby.  If you develop varicose veins:  Wear support pantyhose or compression stockings as told by your healthcare provider.  Elevate your feet for 15 minutes, 3-4 times a day.  Wear a supportive maternity bra to help with breast tenderness. General instructions  Take over-the-counter and prescription  medicines only as told by your health care provider. There are medicines that are either safe or unsafe to take during pregnancy.  Take warm sitz baths to soothe any pain or discomfort caused by hemorrhoids. Use hemorrhoid cream or witch hazel if your health care provider approves.  Avoid cat litter boxes and soil used by cats. These carry germs that can cause birth defects in the baby. If you have a cat, ask someone to clean the litter box for you.  To prepare for the arrival of your baby:  Take prenatal classes to understand, practice, and ask questions about the labor and delivery.  Make a trial run to the hospital.  Visit the hospital and tour the maternity area.  Arrange for maternity or paternity leave through employers.  Arrange for family and friends to take care of pets while you are in the hospital.  Purchase a rear-facing car seat and make sure you know how to install it in your car.  Pack your hospital bag.  Prepare the baby's nursery. Make sure to remove all pillows and stuffed animals from the baby's crib to prevent suffocation.  Visit your dentist if you have not gone during your pregnancy. Use a soft toothbrush to brush your teeth and be gentle when you floss.  Keep all prenatal follow-up visits as told by your health care provider. This is important. Contact a health care provider if:  You are unsure if you are in labor or if your water has broken.  You become dizzy.  You have mild pelvic cramps, pelvic pressure, or nagging pain in your abdominal area.  You have lower back pain.  You have persistent nausea, vomiting, or diarrhea.  You have an unusual or bad smelling vaginal discharge.  You have pain when you urinate. Get help right away if:  You have a fever.  You are leaking fluid from your vagina.  You have spotting or bleeding from your vagina.  You have severe abdominal pain or cramping.  You have rapid weight loss or weight gain.  You have  shortness of breath with chest pain.  You notice sudden or extreme swelling of your face, hands, ankles, feet, or legs.  Your baby makes fewer than 10 movements in 2 hours.  You have severe headaches that do not go away with medicine.  You have vision changes. Summary  The third trimester is from week 29 through week 40, months 7 through 9. The third trimester is a time when the unborn baby (fetus)   is growing rapidly.  During the third trimester, your discomfort may increase as you and your baby continue to gain weight. You may have abdominal, leg, and back pain, sleeping problems, and an increased need to urinate.  During the third trimester your breasts will keep growing and they will continue to become tender. A yellow fluid (colostrum) may leak from your breasts. This is the first milk you are producing for your baby.  False labor is a condition in which you feel small, irregular tightenings of the muscles in the womb (contractions) that eventually go away. These are called Braxton Hicks contractions. Contractions may last for hours, days, or even weeks before true labor sets in.  Signs of labor can include: abdominal cramps; regular contractions that start at 10 minutes apart and become stronger and more frequent with time; watery or bloody mucus discharge that comes from the vagina; increased pelvic pressure and dull back pain; and leaking of amniotic fluid. This information is not intended to replace advice given to you by your health care provider. Make sure you discuss any questions you have with your health care provider. Document Released: 06/28/2001 Document Revised: 12/10/2015 Document Reviewed: 09/04/2012 [employer] Interactive Patient Education  2017 [employer]   Breastfeeding Deciding to breastfeed is one of the best choices you can make for you and your baby. A change in hormones during pregnancy causes your breast tissue to grow and increases the number and size of your  milk ducts. These hormones also allow proteins, sugars, and fats from your blood supply to make breast milk in your milk-producing glands. Hormones prevent breast milk from being released before your baby is born as well as prompt milk flow after birth. Once breastfeeding has begun, thoughts of your baby, as well as his or her sucking or crying, can stimulate the release of milk from your milk-producing glands. Benefits of breastfeeding For Your Baby  Your first milk (colostrum) helps your baby's digestive system function better.  There are antibodies in your milk that help your baby fight off infections.  Your baby has a lower incidence of asthma, allergies, and sudden infant death syndrome.  The nutrients in breast milk are better for your baby than infant formulas and are designed uniquely for your baby's needs.  Breast milk improves your baby's brain development.  Your baby is less likely to develop other conditions, such as childhood obesity, asthma, or type 2 diabetes mellitus. For You  Breastfeeding helps to create a very special bond between you and your baby.  Breastfeeding is convenient. Breast milk is always available at the correct temperature and costs nothing.  Breastfeeding helps to burn calories and helps you lose the weight gained during pregnancy.  Breastfeeding makes your uterus contract to its prepregnancy size faster and slows bleeding (lochia) after you give birth.  Breastfeeding helps to lower your risk of developing type 2 diabetes mellitus, osteoporosis, and breast or ovarian cancer later in life. Signs that your baby is hungry Early Signs of Hunger  Increased alertness or activity.  Stretching.  Movement of the head from side to side.  Movement of the head and opening of the mouth when the corner of the mouth or cheek is stroked (rooting).  Increased sucking sounds, smacking lips, cooing, sighing, or squeaking.  Hand-to-mouth movements.  Increased  sucking of fingers or hands. Late Signs of Hunger  Fussing.  Intermittent crying. Extreme Signs of Hunger  Signs of extreme hunger will require calming and consoling before your baby will   be able to breastfeed successfully. Do not wait for the following signs of extreme hunger to occur before you initiate breastfeeding:  Restlessness.  A loud, strong cry.  Screaming. Breastfeeding basics  Breastfeeding Initiation  Find a comfortable place to sit or lie down, with your neck and back well supported.  Place a pillow or rolled up blanket under your baby to bring him or her to the level of your breast (if you are seated). Nursing pillows are specially designed to help support your arms and your baby while you breastfeed.  Make sure that your baby's abdomen is facing your abdomen.  Gently massage your breast. With your fingertips, massage from your chest wall toward your nipple in a circular motion. This encourages milk flow. You may need to continue this action during the feeding if your milk flows slowly.  Support your breast with 4 fingers underneath and your thumb above your nipple. Make sure your fingers are well away from your nipple and your baby's mouth.  Stroke your baby's lips gently with your finger or nipple.  When your baby's mouth is open wide enough, quickly bring your baby to your breast, placing your entire nipple and as much of the colored area around your nipple (areola) as possible into your baby's mouth.  More areola should be visible above your baby's upper lip than below the lower lip.  Your baby's tongue should be between his or her lower gum and your breast.  Ensure that your baby's mouth is correctly positioned around your nipple (latched). Your baby's lips should create a seal on your breast and be turned out (everted).  It is common for your baby to suck about 2-3 minutes in order to start the flow of breast milk. Latching  Teaching your baby how to latch  on to your breast properly is very important. An improper latch can cause nipple pain and decreased milk supply for you and poor weight gain in your baby. Also, if your baby is not latched onto your nipple properly, he or she may swallow some air during feeding. This can make your baby fussy. Burping your baby when you switch breasts during the feeding can help to get rid of the air. However, teaching your baby to latch on properly is still the best way to prevent fussiness from swallowing air while breastfeeding. Signs that your baby has successfully latched on to your nipple:  Silent tugging or silent sucking, without causing you pain.  Swallowing heard between every 3-4 sucks.  Muscle movement above and in front of his or her ears while sucking. Signs that your baby has not successfully latched on to nipple:  Sucking sounds or smacking sounds from your baby while breastfeeding.  Nipple pain. If you think your baby has not latched on correctly, slip your finger into the corner of your baby's mouth to break the suction and place it between your baby's gums. Attempt breastfeeding initiation again. Signs of Successful Breastfeeding  Signs from your baby:  A gradual decrease in the number of sucks or complete cessation of sucking.  Falling asleep.  Relaxation of his or her body.  Retention of a small amount of milk in his or her mouth.  Letting go of your breast by himself or herself. Signs from you:  Breasts that have increased in firmness, weight, and size 1-3 hours after feeding.  Breasts that are softer immediately after breastfeeding.  Increased milk volume, as well as a change in milk consistency and color by   the fifth day of breastfeeding.  Nipples that are not sore, cracked, or bleeding. Signs That Your Baby is Getting Enough Milk  Wetting at least 1-2 diapers during the first 24 hours after birth.  Wetting at least 5-6 diapers every 24 hours for the first week after  birth. The urine should be clear or pale yellow by 5 days after birth.  Wetting 6-8 diapers every 24 hours as your baby continues to grow and develop.  At least 3 stools in a 24-hour period by age 5 days. The stool should be soft and yellow.  At least 3 stools in a 24-hour period by age 7 days. The stool should be seedy and yellow.  No loss of weight greater than 10% of birth weight during the first 3 days of age.  Average weight gain of 4-7 ounces (113-198 g) per week after age 4 days.  Consistent daily weight gain by age 5 days, without weight loss after the age of 2 weeks. After a feeding, your baby may spit up a small amount. This is common. Breastfeeding frequency and duration Frequent feeding will help you make more milk and can prevent sore nipples and breast engorgement. Breastfeed when you feel the need to reduce the fullness of your breasts or when your baby shows signs of hunger. This is called "breastfeeding on demand." Avoid introducing a pacifier to your baby while you are working to establish breastfeeding (the first 4-6 weeks after your baby is born). After this time you may choose to use a pacifier. Research has shown that pacifier use during the first year of a baby's life decreases the risk of sudden infant death syndrome (SIDS). Allow your baby to feed on each breast as long as he or she wants. Breastfeed until your baby is finished feeding. When your baby unlatches or falls asleep while feeding from the first breast, offer the second breast. Because newborns are often sleepy in the first few weeks of life, you may need to awaken your baby to get him or her to feed. Breastfeeding times will vary from baby to baby. However, the following rules can serve as a guide to help you ensure that your baby is properly fed:  Newborns (babies 4 weeks of age or younger) may breastfeed every 1-3 hours.  Newborns should not go longer than 3 hours during the day or 5 hours during the night  without breastfeeding.  You should breastfeed your baby a minimum of 8 times in a 24-hour period until you begin to introduce solid foods to your baby at around 6 months of age. Breast milk pumping Pumping and storing breast milk allows you to ensure that your baby is exclusively fed your breast milk, even at times when you are unable to breastfeed. This is especially important if you are going back to work while you are still breastfeeding or when you are not able to be present during feedings. Your lactation consultant can give you guidelines on how long it is safe to store breast milk. A breast pump is a machine that allows you to pump milk from your breast into a sterile bottle. The pumped breast milk can then be stored in a refrigerator or freezer. Some breast pumps are operated by hand, while others use electricity. Ask your lactation consultant which type will work best for you. Breast pumps can be purchased, but some hospitals and breastfeeding support groups lease breast pumps on a monthly basis. A lactation consultant can teach you how to   hand express breast milk, if you prefer not to use a pump. Caring for your breasts while you breastfeed Nipples can become dry, cracked, and sore while breastfeeding. The following recommendations can help keep your breasts moisturized and healthy:  Avoid using soap on your nipples.  Wear a supportive bra. Although not required, special nursing bras and tank tops are designed to allow access to your breasts for breastfeeding without taking off your entire bra or top. Avoid wearing underwire-style bras or extremely tight bras.  Air dry your nipples for 3-4minutes after each feeding.  Use only cotton bra pads to absorb leaked breast milk. Leaking of breast milk between feedings is normal.  Use lanolin on your nipples after breastfeeding. Lanolin helps to maintain your skin's normal moisture barrier. If you use pure lanolin, you do not need to wash it off  before feeding your baby again. Pure lanolin is not toxic to your baby. You may also hand express a few drops of breast milk and gently massage that milk into your nipples and allow the milk to air dry. In the first few weeks after giving birth, some women experience extremely full breasts (engorgement). Engorgement can make your breasts feel heavy, warm, and tender to the touch. Engorgement peaks within 3-5 days after you give birth. The following recommendations can help ease engorgement:  Completely empty your breasts while breastfeeding or pumping. You may want to start by applying warm, moist heat (in the shower or with warm water-soaked hand towels) just before feeding or pumping. This increases circulation and helps the milk flow. If your baby does not completely empty your breasts while breastfeeding, pump any extra milk after he or she is finished.  Wear a snug bra (nursing or regular) or tank top for 1-2 days to signal your body to slightly decrease milk production.  Apply ice packs to your breasts, unless this is too uncomfortable for you.  Make sure that your baby is latched on and positioned properly while breastfeeding. If engorgement persists after 48 hours of following these recommendations, contact your health care provider or a lactation consultant. Overall health care recommendations while breastfeeding  Eat healthy foods. Alternate between meals and snacks, eating 3 of each per day. Because what you eat affects your breast milk, some of the foods may make your baby more irritable than usual. Avoid eating these foods if you are sure that they are negatively affecting your baby.  Drink milk, fruit juice, and water to satisfy your thirst (about 10 glasses a day).  Rest often, relax, and continue to take your prenatal vitamins to prevent fatigue, stress, and anemia.  Continue breast self-awareness checks.  Avoid chewing and smoking tobacco. Chemicals from cigarettes that pass  into breast milk and exposure to secondhand smoke may harm your baby.  Avoid alcohol and drug use, including marijuana. Some medicines that may be harmful to your baby can pass through breast milk. It is important to ask your health care provider before taking any medicine, including all over-the-counter and prescription medicine as well as vitamin and herbal supplements. It is possible to become pregnant while breastfeeding. If birth control is desired, ask your health care provider about options that will be safe for your baby. Contact a health care provider if:  You feel like you want to stop breastfeeding or have become frustrated with breastfeeding.  You have painful breasts or nipples.  Your nipples are cracked or bleeding.  Your breasts are red, tender, or warm.  You have   a swollen area on either breast.  You have a fever or chills.  You have nausea or vomiting.  You have drainage other than breast milk from your nipples.  Your breasts do not become full before feedings by the fifth day after you give birth.  You feel sad and depressed.  Your baby is too sleepy to eat well.  Your baby is having trouble sleeping.  Your baby is wetting less than 3 diapers in a 24-hour period.  Your baby has less than 3 stools in a 24-hour period.  Your baby's skin or the white part of his or her eyes becomes yellow.  Your baby is not gaining weight by 5 days of age. Get help right away if:  Your baby is overly tired (lethargic) and does not want to wake up and feed.  Your baby develops an unexplained fever. This information is not intended to replace advice given to you by your health care provider. Make sure you discuss any questions you have with your health care provider. Document Released: 07/04/2005 Document Revised: 12/16/2015 Document Reviewed: 12/26/2012 Elsevier Interactive Patient Education  2017 Elsevier Inc.  

## 2016-06-16 LAB — GC/CHLAMYDIA PROBE AMP (~~LOC~~) NOT AT ARMC
CHLAMYDIA, DNA PROBE: NEGATIVE
Neisseria Gonorrhea: NEGATIVE

## 2016-06-16 LAB — PROTEIN / CREATININE RATIO, URINE
CREATININE, URINE: 34 mg/dL (ref 20–320)
PROTEIN CREATININE RATIO: 235 mg/g{creat} — AB (ref 21–161)
TOTAL PROTEIN, URINE: 8 mg/dL (ref 5–24)

## 2016-06-16 LAB — HIV ANTIBODY (ROUTINE TESTING W REFLEX): HIV: NONREACTIVE

## 2016-06-16 LAB — RPR

## 2016-06-18 LAB — CULTURE, BETA STREP (GROUP B ONLY)

## 2016-06-19 ENCOUNTER — Inpatient Hospital Stay (HOSPITAL_COMMUNITY)
Admission: AD | Admit: 2016-06-19 | Discharge: 2016-06-24 | DRG: 765 | Disposition: A | Discharge status: Home / Self Care | Payer: Medicaid Other | Source: Ambulatory Visit | Attending: Obstetrics & Gynecology | Admitting: Obstetrics & Gynecology

## 2016-06-19 ENCOUNTER — Encounter (HOSPITAL_COMMUNITY): Payer: Self-pay

## 2016-06-19 DIAGNOSIS — O1413 Severe pre-eclampsia, third trimester: Secondary | ICD-10-CM

## 2016-06-19 DIAGNOSIS — Z30017 Encounter for initial prescription of implantable subdermal contraceptive: Secondary | ICD-10-CM

## 2016-06-19 DIAGNOSIS — Z8759 Personal history of other complications of pregnancy, childbirth and the puerperium: Secondary | ICD-10-CM | POA: Diagnosis present

## 2016-06-19 DIAGNOSIS — O34211 Maternal care for low transverse scar from previous cesarean delivery: Secondary | ICD-10-CM | POA: Diagnosis not present

## 2016-06-19 DIAGNOSIS — O1410 Severe pre-eclampsia, unspecified trimester: Principal | ICD-10-CM | POA: Diagnosis present

## 2016-06-19 DIAGNOSIS — Z833 Family history of diabetes mellitus: Secondary | ICD-10-CM | POA: Diagnosis not present

## 2016-06-19 DIAGNOSIS — O134 Gestational [pregnancy-induced] hypertension without significant proteinuria, complicating childbirth: Secondary | ICD-10-CM | POA: Diagnosis not present

## 2016-06-19 DIAGNOSIS — Z3A36 36 weeks gestation of pregnancy: Secondary | ICD-10-CM | POA: Diagnosis not present

## 2016-06-19 DIAGNOSIS — Z3A37 37 weeks gestation of pregnancy: Secondary | ICD-10-CM | POA: Diagnosis not present

## 2016-06-19 DIAGNOSIS — O1204 Gestational edema, complicating childbirth: Secondary | ICD-10-CM | POA: Diagnosis present

## 2016-06-19 DIAGNOSIS — Z98891 History of uterine scar from previous surgery: Secondary | ICD-10-CM

## 2016-06-19 DIAGNOSIS — O139 Gestational [pregnancy-induced] hypertension without significant proteinuria, unspecified trimester: Secondary | ICD-10-CM | POA: Diagnosis present

## 2016-06-19 DIAGNOSIS — O09299 Supervision of pregnancy with other poor reproductive or obstetric history, unspecified trimester: Secondary | ICD-10-CM | POA: Diagnosis present

## 2016-06-19 LAB — RAPID URINE DRUG SCREEN, HOSP PERFORMED
Amphetamines: NOT DETECTED
BENZODIAZEPINES: NOT DETECTED
Barbiturates: NOT DETECTED
COCAINE: NOT DETECTED
Opiates: NOT DETECTED
Tetrahydrocannabinol: NOT DETECTED

## 2016-06-19 LAB — COMPREHENSIVE METABOLIC PANEL
ALBUMIN: 2.8 g/dL — AB (ref 3.5–5.0)
ALK PHOS: 168 U/L — AB (ref 38–126)
ALT: 10 U/L — AB (ref 14–54)
AST: 15 U/L (ref 15–41)
Anion gap: 6 (ref 5–15)
BUN: 7 mg/dL (ref 6–20)
CHLORIDE: 108 mmol/L (ref 101–111)
CO2: 20 mmol/L — AB (ref 22–32)
CREATININE: 0.54 mg/dL (ref 0.44–1.00)
Calcium: 9 mg/dL (ref 8.9–10.3)
GFR calc non Af Amer: >60 mL/min (ref >60)
GLUCOSE: 86 mg/dL (ref 65–99)
Potassium: 4.3 mmol/L (ref 3.5–5.1)
SODIUM: 134 mmol/L — AB (ref 135–145)
Total Bilirubin: 0.3 mg/dL (ref 0.3–1.2)
Total Protein: 6.5 g/dL (ref 6.5–8.1)

## 2016-06-19 LAB — PROTEIN / CREATININE RATIO, URINE
Creatinine, Urine: 26 mg/dL
Creatinine, Urine: 29 mg/dL
PROTEIN CREATININE RATIO: 0.24 mg/mg{creat} — AB (ref 0.00–0.15)
TOTAL PROTEIN, URINE: 7 mg/dL
Total Protein, Urine: <6 mg/dL

## 2016-06-19 LAB — TYPE AND SCREEN
ABO/RH(D): O POS
Antibody Screen: NEGATIVE

## 2016-06-19 LAB — CBC
HCT: 32.3 % — ABNORMAL LOW (ref 36.0–46.0)
Hemoglobin: 10.8 g/dL — ABNORMAL LOW (ref 12.0–15.0)
MCH: 26.9 pg (ref 26.0–34.0)
MCHC: 33.4 g/dL (ref 30.0–36.0)
MCV: 80.3 fL (ref 78.0–100.0)
PLATELETS: 290 10*3/uL (ref 150–400)
RBC: 4.02 MIL/uL (ref 3.87–5.11)
RDW: 14 % (ref 11.5–15.5)
WBC: 10.7 10*3/uL — ABNORMAL HIGH (ref 4.0–10.5)

## 2016-06-19 MED ORDER — LABETALOL HCL 5 MG/ML IV SOLN: 20.0000 mg | INTRAVENOUS | Status: DC | PRN

## 2016-06-19 MED ORDER — BETAMETHASONE SOD PHOS & ACET 6 (3-3) MG/ML IJ SUSP
12.0000 mg | Freq: Once | INTRAMUSCULAR | Status: AC
  Administered 2016-06-19: 12 mg via INTRAMUSCULAR
  Filled 2016-06-19: qty 2

## 2016-06-19 MED ORDER — LIDOCAINE HCL (PF) 1 % IJ SOLN: 30.0000 mL | INTRAMUSCULAR | Status: DC | PRN

## 2016-06-19 MED ORDER — LACTATED RINGERS IV SOLN
INTRAVENOUS | Status: DC
  Administered 2016-06-20 – 2016-06-21 (×2): via INTRAVENOUS

## 2016-06-19 MED ORDER — OXYCODONE-ACETAMINOPHEN 5-325 MG PO TABS: 2.0000 | ORAL_TABLET | ORAL | Status: DC | PRN

## 2016-06-19 MED ORDER — ACETAMINOPHEN 500 MG PO TABS
1000.0000 mg | ORAL_TABLET | Freq: Once | ORAL | Status: AC
  Administered 2016-06-19: 1000 mg via ORAL
  Filled 2016-06-19: qty 2

## 2016-06-19 MED ORDER — METOCLOPRAMIDE HCL 5 MG/ML IJ SOLN
10.0000 mg | Freq: Once | INTRAMUSCULAR | Status: AC
  Administered 2016-06-19: 10 mg via INTRAVENOUS
  Filled 2016-06-19: qty 2

## 2016-06-19 MED ORDER — OXYTOCIN BOLUS FROM INFUSION: 500.0000 mL | Freq: Once | INTRAVENOUS | Status: DC

## 2016-06-19 MED ORDER — ACETAMINOPHEN 325 MG PO TABS
650.0000 mg | ORAL_TABLET | ORAL | Status: DC | PRN
  Administered 2016-06-20: 650 mg via ORAL
  Filled 2016-06-19: qty 2

## 2016-06-19 MED ORDER — HYDRALAZINE HCL 20 MG/ML IJ SOLN: 10.0000 mg | Freq: Once | INTRAMUSCULAR | Status: DC | PRN

## 2016-06-19 MED ORDER — MAGNESIUM SULFATE 50 % IJ SOLN
2.0000 g/h | INTRAVENOUS | Status: DC
  Filled 2016-06-19: qty 80

## 2016-06-19 MED ORDER — SOD CITRATE-CITRIC ACID 500-334 MG/5ML PO SOLN
30.0000 mL | ORAL | Status: DC | PRN
  Administered 2016-06-21: 30 mL via ORAL
  Filled 2016-06-19: qty 15

## 2016-06-19 MED ORDER — FENTANYL CITRATE (PF) 100 MCG/2ML IJ SOLN
50.0000 ug | INTRAMUSCULAR | Status: DC | PRN
  Administered 2016-06-21: 50 ug via INTRAVENOUS
  Filled 2016-06-19: qty 2

## 2016-06-19 MED ORDER — MAGNESIUM SULFATE BOLUS VIA INFUSION
4.0000 g | Freq: Once | INTRAVENOUS | Status: DC
  Filled 2016-06-19: qty 500

## 2016-06-19 MED ORDER — OXYTOCIN 40 UNITS IN LACTATED RINGERS INFUSION - SIMPLE MED
2.5000 [IU]/h | INTRAVENOUS | Status: DC
  Administered 2016-06-21: 40 mL via INTRAVENOUS

## 2016-06-19 MED ORDER — FLEET ENEMA 7-19 GM/118ML RE ENEM: 1.0000 | ENEMA | RECTAL | Status: DC | PRN

## 2016-06-19 MED ORDER — SODIUM CHLORIDE 0.9 % IV SOLN: INTRAVENOUS | Status: DC

## 2016-06-19 MED ORDER — ONDANSETRON HCL 4 MG/2ML IJ SOLN: 4.0000 mg | Freq: Four times a day (QID) | INTRAMUSCULAR | Status: DC | PRN

## 2016-06-19 MED ORDER — DIPHENHYDRAMINE HCL 50 MG/ML IJ SOLN
25.0000 mg | Freq: Once | INTRAMUSCULAR | Status: AC
  Administered 2016-06-19: 25 mg via INTRAVENOUS
  Filled 2016-06-19: qty 1

## 2016-06-19 MED ORDER — BETAMETHASONE SOD PHOS & ACET 6 (3-3) MG/ML IJ SUSP
12.0000 mg | Freq: Once | INTRAMUSCULAR | Status: AC
  Administered 2016-06-20: 12 mg via INTRAMUSCULAR
  Filled 2016-06-19: qty 2

## 2016-06-19 MED ORDER — OXYCODONE-ACETAMINOPHEN 5-325 MG PO TABS: 1.0000 | ORAL_TABLET | ORAL | Status: DC | PRN

## 2016-06-19 MED ORDER — LACTATED RINGERS IV SOLN
500.0000 mL | INTRAVENOUS | Status: DC | PRN
  Administered 2016-06-21: 500 mL via INTRAVENOUS

## 2016-06-19 NOTE — MAU Note (Signed)
Pt C/O back pain, lower abd cramping, hands are swollen - for the last 2 nights.  Denies bleeding or LOF.

## 2016-06-19 NOTE — H&P (Signed)
LABOR AND DELIVERY ADMISSION HISTORY AND PHYSICAL NOTE  HPI: Deborah Wells Degante is a 20 y.o. G1P0 at 1679w5d who presents to maternity admissions reporting headache, hands swelling, contractions/back pain.  Patient is coming in due to headache, swelling in the hands bilaterally, and contractions. Patient states contractions started last night, and have not gone away, she feels very uncomfortable but able to breathe through them. She denies LOF or VB. She has had a headache since last night, took a MOTRIN without relief, went to bed with a headache, woke up with the headache, and it has not gone away yet. She denies changes her in vision or RUQ/epigastric pain. She has had a recent elevated BP in the office but has not been diagnosed with GHTN.   Denies leakage of fluid or vaginal bleeding. Good fetal movement.    MAU Course:  Collected PIH labs, awaiting results. Will give headache cocktail (substitute betamethasone for decadron). Will reevaluate but will admit in case in labor and/or needing induction due to preeclampsia w/ severe features (headache).    Past Medical History: Past Medical History:  Diagnosis Date  . Asthma   . Medical history non-contributory     Past Surgical History: Past Surgical History:  Procedure Laterality Date  . NO PAST SURGERIES      Obstetrical History: OB History    Gravida Para Term Preterm AB Living   1             SAB TAB Ectopic Multiple Live Births                  Social History: Social History   Social History  . Marital status: Single    Spouse name: N/A  . Number of children: N/A  . Years of education: N/A   Social History Main Topics  . Smoking status: Never Smoker  . Smokeless tobacco: Never Used  . Alcohol use No  . Drug use: No  . Sexual activity: Yes    Birth control/ protection: None   Other Topics Concern  . None   Social History Narrative   ** Merged History Encounter **        Family History: Family History   Problem Relation Age of Onset  . Diabetes Mother   . Cancer Maternal Grandmother     Allergies: No Known Allergies  Prescriptions Prior to Admission  Medication Sig Dispense Refill Last Dose  . ibuprofen (ADVIL,MOTRIN) 200 MG tablet Take 400 mg by mouth every 6 (six) hours as needed for headache, mild pain or moderate pain.   06/19/2016 at 0200     Review of Systems   All systems reviewed and negative except as stated in HPI  Blood pressure 163/89, pulse 100, temperature 97.8 F (36.6 C), temperature source Oral, resp. rate 18, last menstrual period 10/01/2015. General appearance: alert, cooperative, appears stated age and no distress Lungs: clear to auscultation bilaterally Heart: regular rate and rhythm Abdomen: soft, non-tender; bowel sounds normal Extremities: No calf swelling or tenderness Presentation: cephalic Fetal monitoring: 130 bpm, mod var, +accels, no decels Uterine activity: q2-505min     Prenatal labs: ABO, Rh: O/POS/-- (07/06 1631) Antibody: NEG (07/06 1631) Rubella: !Error! RPR: NON REAC (11/29 0001)  HBsAg: NEGATIVE (07/06 1631)  HIV: NONREACTIVE (11/29 0001)  GBS: Negative (11/29 0000)  1 hr Glucola: 130 Genetic screening:  Neg Anatomy US: Girl, normal  Prenatal Transfer Tool  Maternal Diabetes: No Genetic Screening: Normal Maternal Ultrasounds/Referrals: Normal Fetal Ultrasounds or other Referrals:  None Maternal Substance Abuse:  No Significant Maternal Medications:  None Significant Maternal Lab Results: Lab values include: Group B Strep negative  Results for orders placed or performed during the hospital encounter of 06/19/16 (from the past 24 hour(s))  Protein / creatinine ratio, urine   Collection Time: 06/19/16  1:35 PM  Result Value Ref Range   Creatinine, Urine 26.00 mg/dL   Total Protein, Urine PENDING mg/dL   Protein Creatinine Ratio PENDING 0.00 - 0.15 mg/mg[Cre]    Patient Active Problem List   Diagnosis Date Noted  .  Supervision of normal first pregnancy, antepartum 01/21/2016    Assessment: Deborah Wells Weimann is a 20 y.o. G1P0 at 3075w5d here for headache with elevated BPs and contractions.  #Labor: Monitor for preterm labor. Assess for worsening BPs and/or if headache doesn't resolve, will induce.  #Pain: Epidural on request if in labor or being induced. Migraine cocktail. Tylenol.  #FWB: Cat I #ID:  GBS Neg #MOF: Breast #MOC: Nexplanon before discharge from hospital #Circ:  N/A - girl  Jen MowElizabeth Satya Bohall, DO OB Fellow [facility] 06/19/2016, 2:12 PM

## 2016-06-19 NOTE — Progress Notes (Signed)
OB Interim Progress Note  S: Patient states she is comfortable.  Was ambulating and just put back on monitor.  Headache has resolved.  Blood pressures stable. Fetal heart monitor Cat I.  Will continue to monitor.    O: BP 138/86   Pulse (!) 116   Temp 98.8 F (37.1 C) (Oral)   Resp 18   Ht 5\' 2"  (1.575 m)   Wt 148 lb (67.1 kg)   LMP 10/01/2015 (Approximate)   BMI 27.07 kg/m    Dilation: 1.5 Effacement (%): 70 Cervical Position: Posterior Station: -3 Presentation: Vertex Exam by:: Dr. Omer JackMumaw   A/P: Continue expectant management Anticipate SVD  Freddrick MarchYashika Amin, MD 06/19/2016, 10:52 PM PGY-1

## 2016-06-20 LAB — PROTEIN, URINE, 24 HOUR
Collection Interval-UPROT: 24 h
Protein, 24H Urine: 238 mg/d — ABNORMAL HIGH (ref 50–100)
Protein, Urine: 7 mg/dL
Urine Total Volume-UPROT: 3400 mL

## 2016-06-20 LAB — SYPHILIS: RPR W/REFLEX TO RPR TITER AND TREPONEMAL ANTIBODIES, TRADITIONAL SCREENING AND DIAGNOSIS ALGORITHM: RPR Ser Ql: NONREACTIVE

## 2016-06-20 MED ORDER — TERBUTALINE SULFATE 1 MG/ML IJ SOLN
0.2500 mg | Freq: Once | INTRAMUSCULAR | Status: AC | PRN
  Administered 2016-06-21: 0.25 mg via SUBCUTANEOUS
  Filled 2016-06-20: qty 1

## 2016-06-20 MED ORDER — ZOLPIDEM TARTRATE 5 MG PO TABS
5.0000 mg | ORAL_TABLET | Freq: Every evening | ORAL | Status: DC | PRN
  Administered 2016-06-21: 5 mg via ORAL
  Filled 2016-06-20: qty 1

## 2016-06-20 MED ORDER — MISOPROSTOL 25 MCG QUARTER TABLET
25.0000 ug | ORAL_TABLET | ORAL | Status: DC | PRN
Start: 2016-06-20 — End: 2016-06-21
  Administered 2016-06-21: 25 ug via VAGINAL
  Filled 2016-06-20: qty 0.25

## 2016-06-20 NOTE — Anesthesia Pain Management Evaluation Note (Signed)
  CRNA Pain Management Visit Note  Patient: Deborah CroftBrooklyn D Kissling, 20 y.o., female  "Hello [facility]. We have an anesthesia team available at all times to provide care throughout the hospital, including epidural management and anesthesia for C-section. I don't know your plan for the delivery whether it a natural birth, water birth, IV sedation, nitrous supplementation, doula or epidural, but we want to meet your pain goals."   1.Was your pain managed to your expectations on prior hospitalizations?   No prior hospitalizations  2.What is your expectation for pain management during this hospitalization?     Epidural  3.How can we help you reach that goal? Place epidural when needed.  Record the patient's initial score and the patient's pain goal.   Pain: 4  Pain Goal: 8 [facility] wants you to be able to say your pain was always managed very well.  Junie Engram 06/20/2016

## 2016-06-21 ENCOUNTER — Inpatient Hospital Stay (HOSPITAL_COMMUNITY): Payer: Medicaid Other | Admitting: Anesthesiology

## 2016-06-21 ENCOUNTER — Encounter (HOSPITAL_COMMUNITY)
Admission: AD | Disposition: A | Discharge status: Home / Self Care | Payer: Self-pay | Source: Ambulatory Visit | Attending: Obstetrics & Gynecology

## 2016-06-21 ENCOUNTER — Encounter (HOSPITAL_COMMUNITY): Payer: Self-pay | Admitting: Anesthesiology

## 2016-06-21 DIAGNOSIS — Z98891 History of uterine scar from previous surgery: Secondary | ICD-10-CM

## 2016-06-21 DIAGNOSIS — O34211 Maternal care for low transverse scar from previous cesarean delivery: Secondary | ICD-10-CM

## 2016-06-21 DIAGNOSIS — O134 Gestational [pregnancy-induced] hypertension without significant proteinuria, complicating childbirth: Secondary | ICD-10-CM

## 2016-06-21 DIAGNOSIS — Z3A37 37 weeks gestation of pregnancy: Secondary | ICD-10-CM

## 2016-06-21 LAB — COMPREHENSIVE METABOLIC PANEL
ALT: 11 U/L — ABNORMAL LOW (ref 14–54)
ANION GAP: 6 (ref 5–15)
AST: 19 U/L (ref 15–41)
Albumin: 2.4 g/dL — ABNORMAL LOW (ref 3.5–5.0)
Alkaline Phosphatase: 135 U/L — ABNORMAL HIGH (ref 38–126)
BILIRUBIN TOTAL: 0.5 mg/dL (ref 0.3–1.2)
BUN: 6 mg/dL (ref 6–20)
CO2: 22 mmol/L (ref 22–32)
Calcium: 8.8 mg/dL — ABNORMAL LOW (ref 8.9–10.3)
Chloride: 106 mmol/L (ref 101–111)
Creatinine, Ser: 0.6 mg/dL (ref 0.44–1.00)
Glucose, Bld: 123 mg/dL — ABNORMAL HIGH (ref 65–99)
POTASSIUM: 4.2 mmol/L (ref 3.5–5.1)
Sodium: 134 mmol/L — ABNORMAL LOW (ref 135–145)
Total Protein: 5.6 g/dL — ABNORMAL LOW (ref 6.5–8.1)

## 2016-06-21 SURGERY — Surgical Case
Anesthesia: General | Wound class: Clean Contaminated

## 2016-06-21 MED ORDER — MAGNESIUM SULFATE BOLUS VIA INFUSION
6.0000 g | Freq: Once | INTRAVENOUS | Status: DC
  Filled 2016-06-21: qty 500

## 2016-06-21 MED ORDER — HYDROMORPHONE HCL 1 MG/ML IJ SOLN
INTRAMUSCULAR | Status: DC | PRN
  Administered 2016-06-21 (×2): 1 mg via INTRAVENOUS

## 2016-06-21 MED ORDER — PROMETHAZINE HCL 25 MG/ML IJ SOLN
12.5000 mg | Freq: Four times a day (QID) | INTRAMUSCULAR | Status: DC | PRN
  Administered 2016-06-21: 12.5 mg via INTRAVENOUS
  Filled 2016-06-21: qty 1

## 2016-06-21 MED ORDER — SIMETHICONE 80 MG PO CHEW
80.0000 mg | CHEWABLE_TABLET | ORAL | Status: DC | PRN
  Administered 2016-06-22 – 2016-06-23 (×2): 80 mg via ORAL
  Filled 2016-06-21 (×2): qty 1

## 2016-06-21 MED ORDER — HYDROMORPHONE HCL 1 MG/ML IJ SOLN
INTRAMUSCULAR | Status: AC
  Filled 2016-06-21: qty 1

## 2016-06-21 MED ORDER — LABETALOL HCL 5 MG/ML IV SOLN: 20.0000 mg | INTRAVENOUS | Status: DC | PRN

## 2016-06-21 MED ORDER — SCOPOLAMINE 1 MG/3DAYS TD PT72
MEDICATED_PATCH | TRANSDERMAL | Status: DC | PRN
  Administered 2016-06-21: 1 via TRANSDERMAL

## 2016-06-21 MED ORDER — MEASLES, MUMPS & RUBELLA VAC ~~LOC~~ INJ: 0.5000 mL | INJECTION | Freq: Once | SUBCUTANEOUS | Status: DC

## 2016-06-21 MED ORDER — LACTATED RINGERS IV SOLN
2.0000 g/h | INTRAVENOUS | Status: AC
  Administered 2016-06-21 – 2016-06-22 (×2): 2 g/h via INTRAVENOUS
  Filled 2016-06-21 (×2): qty 80

## 2016-06-21 MED ORDER — DIBUCAINE 1 % RE OINT: 1.0000 "application " | TOPICAL_OINTMENT | RECTAL | Status: DC | PRN

## 2016-06-21 MED ORDER — BUTORPHANOL TARTRATE 1 MG/ML IJ SOLN
1.0000 mg | INTRAMUSCULAR | Status: DC | PRN
  Administered 2016-06-21: 1 mg via INTRAVENOUS
  Filled 2016-06-21: qty 1

## 2016-06-21 MED ORDER — PROPOFOL 10 MG/ML IV BOLUS
INTRAVENOUS | Status: DC | PRN
  Administered 2016-06-21: 200 mg via INTRAVENOUS

## 2016-06-21 MED ORDER — CEFAZOLIN SODIUM-DEXTROSE 2-4 GM/100ML-% IV SOLN
INTRAVENOUS | Status: AC
Start: 2016-06-21 — End: 2016-06-21
  Filled 2016-06-21: qty 100

## 2016-06-21 MED ORDER — OXYCODONE-ACETAMINOPHEN 5-325 MG PO TABS
2.0000 | ORAL_TABLET | ORAL | Status: DC | PRN
  Administered 2016-06-21 – 2016-06-22 (×5): 2 via ORAL
  Filled 2016-06-21 (×6): qty 2

## 2016-06-21 MED ORDER — DEXAMETHASONE SODIUM PHOSPHATE 4 MG/ML IJ SOLN
INTRAMUSCULAR | Status: DC | PRN
  Administered 2016-06-21: 4 mg via INTRAVENOUS

## 2016-06-21 MED ORDER — OXYCODONE-ACETAMINOPHEN 5-325 MG PO TABS
1.0000 | ORAL_TABLET | ORAL | Status: DC | PRN
  Administered 2016-06-22 – 2016-06-24 (×5): 1 via ORAL
  Filled 2016-06-21 (×6): qty 1

## 2016-06-21 MED ORDER — PROPOFOL 10 MG/ML IV BOLUS
INTRAVENOUS | Status: AC
Start: 2016-06-21 — End: 2016-06-21
  Filled 2016-06-21: qty 20

## 2016-06-21 MED ORDER — MEPERIDINE HCL 25 MG/ML IJ SOLN: 6.2500 mg | INTRAMUSCULAR | Status: DC | PRN

## 2016-06-21 MED ORDER — SUCCINYLCHOLINE CHLORIDE 20 MG/ML IJ SOLN
INTRAMUSCULAR | Status: DC | PRN
  Administered 2016-06-21: 120 mg via INTRAVENOUS

## 2016-06-21 MED ORDER — MENTHOL 3 MG MT LOZG: 1.0000 | LOZENGE | OROMUCOSAL | Status: DC | PRN

## 2016-06-21 MED ORDER — HYDROMORPHONE HCL 1 MG/ML IJ SOLN
0.2500 mg | INTRAMUSCULAR | Status: DC | PRN
  Administered 2016-06-21: 0.25 mg via INTRAVENOUS

## 2016-06-21 MED ORDER — OXYTOCIN 40 UNITS IN LACTATED RINGERS INFUSION - SIMPLE MED: 2.5000 [IU]/h | INTRAVENOUS | Status: AC

## 2016-06-21 MED ORDER — METOCLOPRAMIDE HCL 5 MG/ML IJ SOLN: 10.0000 mg | Freq: Once | INTRAMUSCULAR | Status: DC | PRN

## 2016-06-21 MED ORDER — FENTANYL CITRATE (PF) 100 MCG/2ML IJ SOLN
INTRAMUSCULAR | Status: AC
  Filled 2016-06-21: qty 2

## 2016-06-21 MED ORDER — SUCCINYLCHOLINE CHLORIDE 200 MG/10ML IV SOSY
PREFILLED_SYRINGE | INTRAVENOUS | Status: AC
  Filled 2016-06-21: qty 10

## 2016-06-21 MED ORDER — CEFAZOLIN SODIUM-DEXTROSE 2-3 GM-% IV SOLR
INTRAVENOUS | Status: DC | PRN
  Administered 2016-06-21: 2 g via INTRAVENOUS

## 2016-06-21 MED ORDER — WITCH HAZEL-GLYCERIN EX PADS: 1.0000 "application " | MEDICATED_PAD | CUTANEOUS | Status: DC | PRN

## 2016-06-21 MED ORDER — LIDOCAINE HCL (CARDIAC) 20 MG/ML IV SOLN
INTRAVENOUS | Status: AC
  Filled 2016-06-21: qty 5

## 2016-06-21 MED ORDER — FENTANYL CITRATE (PF) 100 MCG/2ML IJ SOLN
INTRAMUSCULAR | Status: DC | PRN
  Administered 2016-06-21: 50 ug via INTRAVENOUS
  Administered 2016-06-21: 250 ug via INTRAVENOUS

## 2016-06-21 MED ORDER — TERBUTALINE SULFATE 1 MG/ML IJ SOLN
INTRAMUSCULAR | Status: AC
  Filled 2016-06-21: qty 1

## 2016-06-21 MED ORDER — SCOPOLAMINE 1 MG/3DAYS TD PT72
MEDICATED_PATCH | TRANSDERMAL | Status: AC
Start: 2016-06-21 — End: 2016-06-21
  Filled 2016-06-21: qty 1

## 2016-06-21 MED ORDER — MIDAZOLAM HCL 2 MG/2ML IJ SOLN
INTRAMUSCULAR | Status: DC | PRN
  Administered 2016-06-21: 2 mg via INTRAVENOUS

## 2016-06-21 MED ORDER — FENTANYL CITRATE (PF) 250 MCG/5ML IJ SOLN
INTRAMUSCULAR | Status: AC
  Filled 2016-06-21: qty 5

## 2016-06-21 MED ORDER — LACTATED RINGERS IV SOLN
2.0000 g/h | INTRAVENOUS | Status: DC
  Filled 2016-06-21: qty 80

## 2016-06-21 MED ORDER — COCONUT OIL OIL: 1.0000 "application " | TOPICAL_OIL | Status: DC | PRN

## 2016-06-21 MED ORDER — FERROUS SULFATE 325 (65 FE) MG PO TABS
325.0000 mg | ORAL_TABLET | Freq: Two times a day (BID) | ORAL | Status: DC
  Administered 2016-06-21 – 2016-06-24 (×6): 325 mg via ORAL
  Filled 2016-06-21 (×6): qty 1

## 2016-06-21 MED ORDER — ACETAMINOPHEN 325 MG PO TABS: 650.0000 mg | ORAL_TABLET | ORAL | Status: DC | PRN

## 2016-06-21 MED ORDER — LACTATED RINGERS IV SOLN
INTRAVENOUS | Status: DC
  Administered 2016-06-22 (×2): via INTRAVENOUS

## 2016-06-21 MED ORDER — MAGNESIUM HYDROXIDE 400 MG/5ML PO SUSP: 30.0000 mL | ORAL | Status: DC | PRN

## 2016-06-21 MED ORDER — SENNOSIDES-DOCUSATE SODIUM 8.6-50 MG PO TABS
2.0000 | ORAL_TABLET | ORAL | Status: DC
  Administered 2016-06-22 – 2016-06-23 (×3): 2 via ORAL
  Filled 2016-06-21 (×3): qty 2

## 2016-06-21 MED ORDER — DIPHENHYDRAMINE HCL 25 MG PO CAPS: 25.0000 mg | ORAL_CAPSULE | Freq: Four times a day (QID) | ORAL | Status: DC | PRN

## 2016-06-21 MED ORDER — LACTATED RINGERS IV SOLN: INTRAVENOUS | Status: DC

## 2016-06-21 MED ORDER — PHENYLEPHRINE 8 MG IN D5W 100 ML (0.08MG/ML) PREMIX OPTIME
INJECTION | INTRAVENOUS | Status: AC
  Filled 2016-06-21: qty 100

## 2016-06-21 MED ORDER — ZOLPIDEM TARTRATE 5 MG PO TABS: 5.0000 mg | ORAL_TABLET | Freq: Every evening | ORAL | Status: DC | PRN

## 2016-06-21 MED ORDER — LIDOCAINE HCL (CARDIAC) 20 MG/ML IV SOLN
INTRAVENOUS | Status: DC | PRN
  Administered 2016-06-21: 60 mg via INTRAVENOUS

## 2016-06-21 MED ORDER — ONDANSETRON HCL 4 MG/2ML IJ SOLN
INTRAMUSCULAR | Status: AC
  Filled 2016-06-21: qty 2

## 2016-06-21 MED ORDER — MIDAZOLAM HCL 2 MG/2ML IJ SOLN
INTRAMUSCULAR | Status: AC
  Filled 2016-06-21: qty 2

## 2016-06-21 MED ORDER — IBUPROFEN 600 MG PO TABS
600.0000 mg | ORAL_TABLET | Freq: Four times a day (QID) | ORAL | Status: DC
  Administered 2016-06-21 – 2016-06-24 (×12): 600 mg via ORAL
  Filled 2016-06-21 (×12): qty 1

## 2016-06-21 MED ORDER — HYDRALAZINE HCL 20 MG/ML IJ SOLN: 10.0000 mg | Freq: Once | INTRAMUSCULAR | Status: DC | PRN

## 2016-06-21 MED ORDER — DEXAMETHASONE SODIUM PHOSPHATE 4 MG/ML IJ SOLN
INTRAMUSCULAR | Status: AC
  Filled 2016-06-21: qty 1

## 2016-06-21 MED ORDER — MAGNESIUM SULFATE BOLUS VIA INFUSION
4.0000 g | Freq: Once | INTRAVENOUS | Status: AC
  Administered 2016-06-21: 4 g via INTRAVENOUS
  Filled 2016-06-21: qty 500

## 2016-06-21 MED ORDER — TETANUS-DIPHTH-ACELL PERTUSSIS 5-2.5-18.5 LF-MCG/0.5 IM SUSP: 0.5000 mL | Freq: Once | INTRAMUSCULAR | Status: DC

## 2016-06-21 MED ORDER — SIMETHICONE 80 MG PO CHEW
80.0000 mg | CHEWABLE_TABLET | ORAL | Status: DC
  Administered 2016-06-22 – 2016-06-23 (×2): 80 mg via ORAL
  Filled 2016-06-21 (×3): qty 1

## 2016-06-21 MED ORDER — PRENATAL MULTIVITAMIN CH
1.0000 | ORAL_TABLET | Freq: Every day | ORAL | Status: DC
  Administered 2016-06-22 – 2016-06-24 (×3): 1 via ORAL
  Filled 2016-06-21 (×3): qty 1

## 2016-06-21 MED ORDER — ONDANSETRON HCL 4 MG/2ML IJ SOLN
INTRAMUSCULAR | Status: DC | PRN
  Administered 2016-06-21: 4 mg via INTRAVENOUS

## 2016-06-21 MED ORDER — OXYTOCIN 10 UNIT/ML IJ SOLN
INTRAMUSCULAR | Status: AC
  Filled 2016-06-21: qty 4

## 2016-06-21 SURGICAL SUPPLY — 33 items
BENZOIN TINCTURE PRP APPL 2/3 (GAUZE/BANDAGES/DRESSINGS) ×3 IMPLANT
CHLORAPREP W/TINT 26ML (MISCELLANEOUS) ×3 IMPLANT
CLAMP CORD UMBIL (MISCELLANEOUS) IMPLANT
CLOSURE STERI STRIP 1/2 X4 (GAUZE/BANDAGES/DRESSINGS) ×3 IMPLANT
CLOTH BEACON ORANGE TIMEOUT ST (SAFETY) ×3 IMPLANT
DRSG OPSITE POSTOP 4X10 (GAUZE/BANDAGES/DRESSINGS) ×3 IMPLANT
ELECT REM PT RETURN 9FT ADLT (ELECTROSURGICAL) ×3
ELECTRODE REM PT RTRN 9FT ADLT (ELECTROSURGICAL) ×1 IMPLANT
EXTRACTOR VACUUM M CUP 4 TUBE (SUCTIONS) IMPLANT
EXTRACTOR VACUUM M CUP 4' TUBE (SUCTIONS)
GLOVE BIOGEL PI IND STRL 7.0 (GLOVE) ×3 IMPLANT
GLOVE BIOGEL PI INDICATOR 7.0 (GLOVE) ×6
GLOVE ECLIPSE 7.0 STRL STRAW (GLOVE) ×3 IMPLANT
GOWN STRL REUS W/TWL LRG LVL3 (GOWN DISPOSABLE) ×6 IMPLANT
KIT ABG SYR 3ML LUER SLIP (SYRINGE) IMPLANT
NEEDLE HYPO 22GX1.5 SAFETY (NEEDLE) ×3 IMPLANT
NEEDLE HYPO 25X5/8 SAFETYGLIDE (NEEDLE) ×6 IMPLANT
NS IRRIG 1000ML POUR BTL (IV SOLUTION) ×3 IMPLANT
PACK C SECTION WH (CUSTOM PROCEDURE TRAY) ×3 IMPLANT
PAD ABD 7.5X8 STRL (GAUZE/BANDAGES/DRESSINGS) ×3 IMPLANT
PAD OB MATERNITY 4.3X12.25 (PERSONAL CARE ITEMS) ×3 IMPLANT
PENCIL SMOKE EVAC W/HOLSTER (ELECTROSURGICAL) ×3 IMPLANT
RTRCTR C-SECT PINK 25CM LRG (MISCELLANEOUS) IMPLANT
SPONGE GAUZE 4X4 12PLY (GAUZE/BANDAGES/DRESSINGS) ×3 IMPLANT
SUT PDS AB 0 CTX 36 PDP370T (SUTURE) IMPLANT
SUT PLAIN 2 0 XLH (SUTURE) ×3 IMPLANT
SUT VIC AB 0 CTX 36 (SUTURE) ×6
SUT VIC AB 0 CTX36XBRD ANBCTRL (SUTURE) ×3 IMPLANT
SUT VIC AB 4-0 KS 27 (SUTURE) ×3 IMPLANT
SYR 3ML 25GX5/8 SAFETY (SYRINGE) ×3 IMPLANT
SYR CONTROL 10ML LL (SYRINGE) ×3 IMPLANT
TOWEL OR 17X24 6PK STRL BLUE (TOWEL DISPOSABLE) ×3 IMPLANT
TRAY FOLEY CATH SILVER 14FR (SET/KITS/TRAYS/PACK) ×3 IMPLANT

## 2016-06-21 NOTE — Transfer of Care (Signed)
Immediate Anesthesia Transfer of Care Note  Patient: Deborah CroftBrooklyn D Wells  Procedure(s) Performed: Procedure(s): CESAREAN SECTION (N/A)  Patient Location: PACU  Anesthesia Type:General  Level of Consciousness: sedated  Airway & Oxygen Therapy: Patient Spontanous Breathing and Patient connected to nasal cannula oxygen  Post-op Assessment: Report given to RN and Post -op Vital signs reviewed and stable  Post vital signs: Reviewed and stable BP 141/95, HR 85 RR 16, oxygen saturation 98%  Last Vitals:  Vitals:   06/21/16 0747 06/21/16 0811  BP: (!) 130/115 137/74  Pulse: 76 71  Resp:  18  Temp:      Last Pain:  Vitals:   06/21/16 0811  TempSrc:   PainSc: 9       Patients Stated Pain Goal: 0 (06/19/16 1315)  Complications: No apparent anesthesia complications

## 2016-06-21 NOTE — Progress Notes (Signed)
Patient ID: Ivan CroftBrooklyn D Andreasen, female   DOB: [birth year], 20 y.o.   MRN: [id number]  Comfortable; no complaints  BP 142/84, other VSS FHR 135-140, +accels, no decels Ctx irreg Cx 1+/70/-2  24h u= 238  IUP@37 .0 gHTN- stable BP  Pt unable to tol exam and so foley unable to be placed; cytotec x 1 PV given Will repeat cytotec in 4 hrs and may try foley again  Cam HaiSHAW, KIMBERLY CNM 06/21/2016 12:38 AM

## 2016-06-21 NOTE — Op Note (Signed)
Deborah Wells PROCEDURE DATE: 06/21/2016  PREOPERATIVE DIAGNOSES: Intrauterine pregnancy at 4847w0d weeks gestation; non-reassuring fetal status with fetal bradycardia; IOL for GHTN  POSTOPERATIVE DIAGNOSES: The same  PROCEDURE: Emergent Primary Repeat Low Transverse Cesarean Section  SURGEON:  Dr. Jaynie CollinsUgonna Alexyia Guarino  ASSISTANT:  Dr. Shonna ChockNoah Wouk and Dr. Deforest HoylesNoah Wallace  ANESTHESIOLOGIST: Dr. Mal AmabileMIchael Foster  INDICATIONS: Deborah CroftBrooklyn D Wells is a 20 y.o. G1P0 at 1547w0d here for stat cesarean section secondary to the indications listed under preoperative diagnoses; please see preoperative note for further details.  The risks of cesarean section were discussed with the patient including but were not limited to: bleeding; infection; injury to surrounding organs; injury to the fetus; need for additional procedures including hysterectomy in the event of a life-threatening hemorrhage; placental abnormalities wth subsequent pregnancies, incisional problems, thromboembolic phenomenon and other postoperative/anesthesia complications.   The patient concurred with the proposed plan, giving informed written consent for the procedure.    FINDINGS:  Viable female infant in cephalic presentation.  Apgars 1 and 7, arterial cord pH 7.11.  Clear amniotic fluid.  Intact placenta, three vessel cord, no cord anomalies.  Normal uterus, fallopian tubes and ovaries bilaterally.  ANESTHESIA: General INTRAVENOUS FLUIDS: 600 ml ESTIMATED BLOOD LOSS: 600 ml URINE OUTPUT:  250 ml SPECIMENS: Placenta sent to pathology COMPLICATIONS: None immediate   PROCEDURE IN DETAIL: The patient was urgently taken to the the operating room and she was then placed in a dorsal supine position with a leftward tilt, prepped quickly with betadine and draped in a sterile manner. After a timeout was performed, general anesthesia was administered, and a Pfannenstiel skin incision was made with scalpel and carried through to the underlying layer of  fascia. The fascial incision was extended bilaterally in a blunt fashion. The fascia was separated from underlying rectus muscles bluntly. The rectus muscles were separated in the midline bluntly and the peritoneum was entered bluntly. Attention was turned to the lower uterine segment where a low transverse hysterotomy was made with a scalpel and extended bilaterally bluntly. The infant was successfully delivered, the cord was clamped and cut and the infant was handed over to awaiting neonatology team. Incision to delivery time was less than one minute. The placenta was delivered intact and had a three-vessel cord. The uterus was then cleared of clot and debris. The hysterotomy was closed with 0 Vicryl in a running locked fashion, and an imbricating layer was also placed with 0 Vicryl. The pelvis was cleared of all clot and debris. Hemostasis was confirmed on all surfaces. The peritoneum was closed with a 0 Vicryl pursestring stitch  The fascia was then closed using 0 Vicryl in a running fashion. The skin was closed with a 4-0 Vicryl subcuticular stitch. The patient tolerated the procedure well. Sponge, lap, instrument and needle counts were correct x 3. She was taken to the recovery room in stable condition.    Jaynie CollinsUGONNA  Antwion Carpenter, MD, FACOG Attending Obstetrician & Gynecologist Faculty Practice, [facility] [place]

## 2016-06-21 NOTE — Progress Notes (Signed)
Faculty Practice Attending Note (late entry)  Was called to evaluate patient with fetal bradycardia to the 70-90s. Not responsive to terbutaline, oxygen or other intrauterine neonatal resuscitation maneuvers.  Code Cesarean called, risks of cesarean section reviewed. To OR now.  Deborah NewcomerUgonna A Khaliyah Northrop, MD

## 2016-06-21 NOTE — Lactation Note (Addendum)
This note was copied from a baby's chart. Lactation Consultation Note  Patient Name: Girl Rebeca AllegraBrooklyn Costabile ZOXWR'UToday's Date: 06/21/2016 Reason for consult: Initial assessment;Infant < 6lbs;Late preterm infant   Initial assessment with first time mom of 6 hour old infant. Infant initially with hypoglycemia and was supplemented with formula. Infant now in room with mom and STS, attempted to latch infant to left breast, infant sleepy. Spoon fed infant 2 cc Colostrum via spoon, she tolerated it well. Then latched her to left breast in the laid back cross cradle hold. Mom was sleepy and did not assist much with feeding. Infant fed actively for 35 minutes, swallows were noted. She was then removed and was spoon fed 3 cc colostrum, she tolerated it well.   Mom with soft compressible breasts and everted nipples. Colostrum abundant, hand expressed 15 cc colostrum post BF from both breasts. Mom did not assist with hand expression due to sleepiness, but agreeable in letting LC obtain colostrum for infant. Colostrum was labeled and some left at bedside for next feeding and more was labeled and placed in refrigerator.   LPT infant policy shared with family as infant 37 weeks and < 5 lb. Discussed with family that infant needs to be fed at least every 3 hours and should be awakened as needed. Discussed that infant needs to be supplemented after BF with EBM/ 22 cal formula (per Dr. Ezequiel EssexGable) Discussed need for decreased stimulation for infant between feeds and keeping infant hat on at all times. Discussed importance of STS for infant.   Advised mom that pumping would should be started as soon as mom is able. Mom agreeable. Pump is in room and set up, mom needs education on use as she was asleep after infant feeding. Plan is to follow up later today. Mom needs further teaching in BF, pump set up, use and cleaning, spoon feeding, and Hand expression when she is more alert. Her sisters were present during teaching and are aware  infant needs to eat every 3 hours. Dad came in at end of feeding, discussed LPT infant policy with him also.   LC Brochure and BF Resources Handout left at bedside. [facility]. She reports she does not have a pump at home.   Report to Dr. Norm ParcelGable, Terry, RN (AICU) and Herbert SetaHeather RN [facility](Central Nursery).       Maternal Data Formula Feeding for Exclusion: No Has patient been taught Hand Expression?: No Does the patient have breastfeeding experience prior to this delivery?: No  Feeding Feeding Type: Breast Fed Nipple Type: Slow - flow Length of feed: 35 min  LATCH Score/Interventions Latch: Grasps breast easily, tongue down, lips flanged, rhythmical sucking.  Audible Swallowing: Spontaneous and intermittent  Type of Nipple: Everted at rest and after stimulation  Comfort (Breast/Nipple): Soft / non-tender     Hold (Positioning): Full assist, staff holds infant at breast Intervention(s): Breastfeeding basics reviewed;Support Pillows;Position options;Skin to skin  LATCH Score: 8  Lactation Tools Discussed/Used WIC Program: No (Plans to apply)   Consult Status Consult Status: Follow-up Date: 06/21/16 Follow-up type: In-patient    Silas FloodSharon S Hice 06/21/2016, 4:28 PM

## 2016-06-21 NOTE — Lactation Note (Signed)
This note was copied from a baby's chart. Lactation Consultation Note  Patient Name: Deborah Wells ZOXWR'UToday's Date: 06/21/2016 Reason for consult: Follow-up assessment;Other (Comment);NICU baby (hypoglycemia)   Follow up with infant due to low blood glucose. Attempted to get infant to take a bottle of EBM, infant made little effort to suckle. Infant was transferred to NICU shortly thereafter.   Set up DEBP. Showed mom and MGM how to set up, assemble, disassemble, and clean pump parts. Mom pumped in Initiate setting for 15 minutes and then LC hand expressed mom with mom's permission. Obtained about 7-8 cc colostrum that was taken to NICU. Gave Providing milk for Your Baby in NICU and explained pumping and what to expect and milk coming to volume. Reviewed BM storage for the NICU infant. Mom has colostrum collection containers, # labels and BM Labels in the room.   Follow up tomorrow and prn. Update to Aurther Lofterry, RN (AICU)   Maternal Data Formula Feeding for Exclusion: No Has patient been taught Hand Expression?: Yes Does the patient have breastfeeding experience prior to this delivery?: No  Feeding Feeding Type: Breast Milk Nipple Type: Slow - flow Length of feed: 10 min  LATCH Score/Interventions Latch: Too sleepy or reluctant, no latch achieved, no sucking elicited.  Audible Swallowing: Spontaneous and intermittent  Type of Nipple: Everted at rest and after stimulation  Comfort (Breast/Nipple): Soft / non-tender     Hold (Positioning): Full assist, staff holds infant at breast Intervention(s): Breastfeeding basics reviewed;Support Pillows;Position options;Skin to skin  LATCH Score: 8  Lactation Tools Discussed/Used WIC Program: No (Plans to apply) Pump Review: Setup, frequency, and cleaning;Milk Storage   Consult Status Consult Status: Follow-up Date: 06/22/16 Follow-up type: In-patient    Silas FloodSharon S Lataunya Ruud 06/21/2016, 6:41 PM

## 2016-06-21 NOTE — Anesthesia Preprocedure Evaluation (Signed)
Anesthesia Evaluation  Patient identified by MRN, date of birth, ID band Patient awake    Reviewed: Allergy & Precautions, NPO status , Patient's Chart, lab work & pertinent test results  Airway Mallampati: III  TM Distance: >3 FB Neck ROM: Full    Dental no notable dental hx. (+) Teeth Intact   Pulmonary neg pulmonary ROS,    Pulmonary exam normal breath sounds clear to auscultation       Cardiovascular hypertension, Normal cardiovascular exam Rhythm:Regular Rate:Normal     Neuro/Psych negative neurological ROS  negative psych ROS   GI/Hepatic negative GI ROS, Neg liver ROS,   Endo/Other  negative endocrine ROS  Renal/GU negative Renal ROS  negative genitourinary   Musculoskeletal negative musculoskeletal ROS (+)   Abdominal   Peds  Hematology  (+) anemia ,   Anesthesia Other Findings   Reproductive/Obstetrics (+) Pregnancy STAT C/Section for fetal distress                             Lab Results  Component Value Date   WBC 10.7 (H) 06/19/2016   HGB 10.8 (L) 06/19/2016   HCT 32.3 (L) 06/19/2016   MCV 80.3 06/19/2016   PLT 290 06/19/2016     Chemistry      Component Value Date/Time   NA 134 (L) 06/19/2016 1415   K 4.3 06/19/2016 1415   CL 108 06/19/2016 1415   CO2 20 (L) 06/19/2016 1415   BUN 7 06/19/2016 1415   CREATININE 0.54 06/19/2016 1415   CREATININE 0.64 06/15/2016 0001      Component Value Date/Time   CALCIUM 9.0 06/19/2016 1415   ALKPHOS 168 (H) 06/19/2016 1415   AST 15 06/19/2016 1415   ALT 10 (L) 06/19/2016 1415   BILITOT 0.3 06/19/2016 1415      Anesthesia Physical Anesthesia Plan  ASA: III and emergent  Anesthesia Plan: General   Post-op Pain Management:    Induction: Intravenous, Rapid sequence and Cricoid pressure planned  Airway Management Planned: Oral ETT  Additional Equipment:   Intra-op Plan:   Post-operative Plan: Extubation in  OR  Informed Consent: I have reviewed the patients History and Physical, chart, labs and discussed the procedure including the risks, benefits and alternatives for the proposed anesthesia with the patient or authorized representative who has indicated his/her understanding and acceptance.   Dental advisory given  Plan Discussed with: Anesthesiologist, CRNA and Surgeon  Anesthesia Plan Comments:         Anesthesia Quick Evaluation

## 2016-06-21 NOTE — Progress Notes (Signed)
S: Patient seen & examined. Patient comfortable and in NAD.No PIH symptoms    O: 142/79, 133/82 Vitals:   06/21/16 0441 06/21/16 0446 06/21/16 0451 06/21/16 0709  BP:    129/73  Pulse: 71 96  88  Resp:    18  Temp:   98.3 F (36.8 C)   TempSrc:   Axillary   SpO2: 100% 99%    Weight:      Height:        Dilation: 1.5 Effacement (%): 70 Cervical Position: Posterior Station: -3 Presentation: Vertex Exam by:: Pincus BadderK. Raekwon Winkowski, CNM   FHT: Cat I tracing TOCO: irregular   A/P: Plan for IOL with cytotec and FB at midnight Continue expectant management Anticipate SVD  I have seen and examined this patient and I agree with the above. Cam HaiSHAW, Jerid Catherman CNM 9:33 AM 06/21/2016     PGY-3

## 2016-06-21 NOTE — Anesthesia Postprocedure Evaluation (Signed)
Anesthesia Post Note  Patient: Deborah Wells  Procedure(s) Performed: Procedure(s) (LRB): CESAREAN SECTION (N/A)  Patient location during evaluation: PACU Anesthesia Type: General Level of consciousness: awake and alert and oriented Pain management: pain level controlled Vital Signs Assessment: post-procedure vital signs reviewed and stable Respiratory status: spontaneous breathing, nonlabored ventilation, respiratory function stable and patient connected to nasal cannula oxygen Cardiovascular status: blood pressure returned to baseline and stable Postop Assessment: no signs of nausea or vomiting Anesthetic complications: no     Last Vitals:  Vitals:   06/21/16 1100 06/21/16 1115  BP:    Pulse:    Resp: (P) 16 (P) 16  Temp:  (P) 37.1 C    Last Pain:  Vitals:   06/21/16 1045  TempSrc:   PainSc: Asleep   Pain Goal: Patients Stated Pain Goal: 0 (06/19/16 1315)               Deborah Wells A.

## 2016-06-21 NOTE — Progress Notes (Signed)
Patient received to room 321 post C/S. Pitocin infusing at 62.5/hr and Magnesium infusing @25cc /hr. Fundus is firm @u -1. Patient in and out of a sleep state but easily awakened. Pt has no complaints of pain at this time. Foley is draining clear, yellow urine. Carmelina DaneERRI L Vernessa Likes, RN

## 2016-06-21 NOTE — Lactation Note (Signed)
This note was copied from a baby's chart. Lactation Consultation Note  Patient Name: Deborah Wells VHQIO'NToday's Date: 06/21/2016 Reason for consult: Follow-up assessment;Other (Comment);NICU baby (hypoglycemia) [facility]   Maternal Data Has patient been taught Hand Expression?: Yes Does the patient have breastfeeding experience prior to this delivery?: No  Feeding Feeding Type: Breast Milk Nipple Type: Slow - flow Length of feed: 10 min  LATCH Score/Interventions Latch: Too sleepy or reluctant, no latch achieved, no sucking elicited.                    Lactation Tools Discussed/Used Pump Review: Setup, frequency, and cleaning;Milk Storage   Consult Status Consult Status: Follow-up Date: 06/22/16 Follow-up type: In-patient    Rulon Eisenmengerlizabeth E Rosella Crandell 06/21/2016, 8:18 PM

## 2016-06-21 NOTE — Progress Notes (Signed)
Patient ID: Ivan CroftBrooklyn D Wells, female   DOB: Mar 21, 1996, 20 y.o.   MRN: [id number]  Pt rec'd one dose of cytotec at [place]; had FHR variables so held the second dose VSS, afeb BP 128/67 FHR 130s, +accels, avg LTV, mi variables Cx 1-2/70/-2  IUP@gHTN  IOL process  Cervical balloon inserted without difficulty Will leave in until it comes out, then plan Deretha Emoryit  Laurin Paulo CNM 06/21/2016 7:26 AM

## 2016-06-21 NOTE — Anesthesia Procedure Notes (Signed)
Procedure Name: Intubation Date/Time: 06/21/2016 8:50 AM Performed by: Elbert EwingsHYMER, Martell Mcfadyen S Pre-anesthesia Checklist: Patient identified, Emergency Drugs available, Suction available, Patient being monitored and Timeout performed Patient Re-evaluated:Patient Re-evaluated prior to inductionOxygen Delivery Method: Circle system utilized Preoxygenation: Pre-oxygenation with 100% oxygen Intubation Type: Rapid sequence, Cricoid Pressure applied and IV induction Laryngoscope Size: Glidescope and 3 (Intubated by SRNA) Grade View: Grade I Tube type: Oral Tube size: 7.0 mm Number of attempts: 1 Airway Equipment and Method: Stylet Placement Confirmation: ETT inserted through vocal cords under direct vision,  positive ETCO2 and breath sounds checked- equal and bilateral Secured at: 21 cm Tube secured with: Tape Dental Injury: Teeth and Oropharynx as per pre-operative assessment

## 2016-06-22 ENCOUNTER — Encounter: Payer: Medicaid Other | Admitting: Medical

## 2016-06-22 LAB — CBC
HCT: 25.2 % — ABNORMAL LOW (ref 36.0–46.0)
Hemoglobin: 8.6 g/dL — ABNORMAL LOW (ref 12.0–15.0)
MCH: 27.3 pg (ref 26.0–34.0)
MCHC: 34.1 g/dL (ref 30.0–36.0)
MCV: 80 fL (ref 78.0–100.0)
PLATELETS: 255 10*3/uL (ref 150–400)
RBC: 3.15 MIL/uL — ABNORMAL LOW (ref 3.87–5.11)
RDW: 14.7 % (ref 11.5–15.5)
WBC: 18.2 10*3/uL — ABNORMAL HIGH (ref 4.0–10.5)

## 2016-06-22 LAB — COMPREHENSIVE METABOLIC PANEL
ALT: 13 U/L — ABNORMAL LOW (ref 14–54)
AST: 19 U/L (ref 15–41)
Albumin: 2.2 g/dL — ABNORMAL LOW (ref 3.5–5.0)
Alkaline Phosphatase: 121 U/L (ref 38–126)
Anion gap: 5 (ref 5–15)
BILIRUBIN TOTAL: 0.2 mg/dL — AB (ref 0.3–1.2)
BUN: 7 mg/dL (ref 6–20)
CHLORIDE: 103 mmol/L (ref 101–111)
CO2: 25 mmol/L (ref 22–32)
Calcium: 7.1 mg/dL — ABNORMAL LOW (ref 8.9–10.3)
Creatinine, Ser: 0.73 mg/dL (ref 0.44–1.00)
Glucose, Bld: 136 mg/dL — ABNORMAL HIGH (ref 65–99)
POTASSIUM: 3.8 mmol/L (ref 3.5–5.1)
Sodium: 133 mmol/L — ABNORMAL LOW (ref 135–145)
TOTAL PROTEIN: 5.3 g/dL — AB (ref 6.5–8.1)

## 2016-06-22 LAB — MAGNESIUM: MAGNESIUM: 5.8 mg/dL — AB (ref 1.7–2.4)

## 2016-06-22 MED ORDER — AMLODIPINE BESYLATE 5 MG PO TABS
5.0000 mg | ORAL_TABLET | Freq: Every day | ORAL | Status: DC
  Administered 2016-06-22 – 2016-06-24 (×3): 5 mg via ORAL
  Filled 2016-06-22 (×2): qty 1

## 2016-06-22 NOTE — Progress Notes (Signed)
Patient's foley dc'd . Patient tolerated well. Patient is sleepy and wanted to go see her infant. Wheelchair given.

## 2016-06-22 NOTE — Lactation Note (Signed)
This note was copied from a baby's chart. Lactation Consultation Note  Patient Name: Girl Rebeca AllegraBrooklyn Davern ZOXWR'UToday's Date: 06/22/2016  Follow up visit made.  Mom very drowsy and last pumped.  Mom last pumped 5 hours ago.  Reviewed how to turn pump on and adjust the strength.  Mom is obtaining transitional milk.  Instructed on cleaning pieces and EBM storage.  It is difficult to know what patient has comprehended due to lack of interaction from her.  Family member present and repeated back instructions.  Stressed importance of pumping 8 times/24 hours to establish milk supply.  Encouraged to call with questions/concerns.   Maternal Data    Feeding Feeding Type: Formula Length of feed: 30 min  LATCH Score/Interventions                      Lactation Tools Discussed/Used Tools: 56F feeding tube / Syringe   Consult Status      Huston FoleyMOULDEN, Rocsi Hazelbaker S 06/22/2016, 3:44 PM

## 2016-06-22 NOTE — Progress Notes (Signed)
POSTPARTUM PROGRESS NOTE  Post Partum Day 1 Subjective:  Deborah Wells is a 20 y.o. G1P1001 4438w0d s/p stat PLTCS for NRFHT.  No acute events overnight.  Pt denies problems with ambulating, voiding or po intake.  She denies nausea or vomiting.  Pain is well controlled.  She has had flatus. She has not had bowel movement.  Lochia Small.   Objective: Blood pressure (!) 143/60, pulse 85, temperature 98 F (36.7 C), resp. rate 18, height 5\' 2"  (1.575 m), weight 148 lb (67.1 kg), last menstrual period 10/01/2015, SpO2 94 %, unknown if currently breastfeeding.  Physical Exam:  General: alert, cooperative and no distress Lochia:normal flow Chest: CTAB Heart: RRR no m/r/g Abdomen: +BS, soft, nontender,  Uterine Fundus: firm, intact DVT Evaluation: No calf swelling or tenderness Extremities: no edema Reflexes : (+)2/4 intact bilateral UE/LE Incision: Pressure dressing applied, no weepage    Recent Labs  06/19/16 1415 06/22/16 0557  HGB 10.8* 8.6*  HCT 32.3* 25.2*    Assessment/Plan:  ASSESSMENT: [name] is a 20 y.o. G1P1001 3238w0d s/p stat PLTCS due to NRFHT  Started on 5mg  of norvasc with some elevated BP's and pre-eclampsia.  Magnesium to stop today.  Plan for discharge tomorrow, Breastfeeding, Lactation consult and Contraception nexplanon   LOS: 3 days   Berniece SalinesWALLACE, NOAH I, DO PGY-3 Center for [facility], [facility]  06/22/2016, 7:32 AM   OB FELLOW POSTPARTUM PROGRESS NOTE ATTESTATION  I have seen and examined this patient and agree with above documentation in the resident's note.   Ernestina PennaNicholas Schenk, MD 11:18 AM

## 2016-06-22 NOTE — Progress Notes (Signed)
Dr. Alvester MorinNewton contacted: pt bp 129/61.  MD ok to continue with Norvasc.

## 2016-06-23 MED ORDER — ETONOGESTREL 68 MG ~~LOC~~ IMPL
68.0000 mg | DRUG_IMPLANT | Freq: Once | SUBCUTANEOUS | Status: AC
  Administered 2016-06-24: 68 mg via SUBCUTANEOUS
  Filled 2016-06-23: qty 1

## 2016-06-23 MED ORDER — LIDOCAINE HCL 1 % IJ SOLN
0.0000 mL | Freq: Once | INTRAMUSCULAR | Status: AC | PRN
  Administered 2016-06-24: 10 mL via INTRADERMAL
  Filled 2016-06-23: qty 20

## 2016-06-23 NOTE — Progress Notes (Signed)
POSTPARTUM PROGRESS NOTE  Post Operative Day 2 Subjective:  Ivan CroftBrooklyn D Friis is a 20 y.o. G1P1001 4221w0d s/p PLTCS.  No acute events overnight.  Pt denies problems with ambulating, voiding or po intake.  She denies nausea or vomiting.  Pain is moderately controlled.  She has had flatus. She has not had bowel movement.  Lochia Small.   Objective: Blood pressure 132/82, pulse 78, temperature 98.8 F (37.1 C), temperature source Oral, resp. rate 18, height 5\' 2"  (1.575 m), weight 148 lb (67.1 kg), last menstrual period 10/01/2015, SpO2 98 %, unknown if currently breastfeeding.  Physical Exam:  General: alert, cooperative and no distress Lochia:normal flow Chest: no respiratory distress Heart:regular rate, distal pulses intact Incision: C/D/I Abdomen: soft, nontender,  Uterine Fundus: firm, appropriately tender DVT Evaluation: No calf swelling or tenderness Extremities: trace edema   Recent Labs  06/22/16 0557  HGB 8.6*  HCT 25.2*    Assessment/Plan:  ASSESSMENT: Ivan CroftBrooklyn D Dugo is a 20 y.o. G1P1001 4221w0d s/p PLTCS and Pre-eclampsia with severe features now off magnesium  Continue Amlodipine 5mg  today   Plan for discharge tomorrow   LOS: 4 days   Les Pouicholas SchenkMD 06/23/2016, 8:56 AM

## 2016-06-23 NOTE — Lactation Note (Signed)
This note was copied from a baby's chart. Lactation Consultation Note  Patient Name: Deborah Wells Reason for consult: Follow-up assessment;NICU baby  NICU baby 3651 hours old. Mom just finishing pumping when this LC entered the room. Mom is pumping 10-15 ml of EBM now. Enc mom to use maintenance setting. Mom had EBM in 2 colostrum containers, so assisted with transferring to larger bottles. Mom given colostrum stickers with review. Mom reports that she and FOB are about to take EBM to NICU for the baby. Discussed EBM storage guidelines. Mom reports [facility] on day of D/C---tentatively 06-24-16. Mom states that she is pumping every 2-3 hours for a total of at least 8 times per 24 hours followed by hand expression.  Maternal Data    Feeding Feeding Type: Formula Nipple Type: Slow - flow Length of feed: 30 min  LATCH Score/Interventions                      Lactation Tools Discussed/Used Tools: Pump Breast pump type: Double-Electric Breast Pump   Consult Status Consult Status: Follow-up Date: 06/24/16 Follow-up type: In-patient    Deborah Wells Wells, 12:28 PM

## 2016-06-24 ENCOUNTER — Encounter (HOSPITAL_COMMUNITY): Payer: Self-pay | Admitting: Family Medicine

## 2016-06-24 HISTORY — PX: INSERTION OF NON VAGINAL CONTRACEPTIVE DEVICE: SHX6253

## 2016-06-24 MED ORDER — IBUPROFEN 600 MG PO TABS: 600.0000 mg | ORAL_TABLET | Freq: Four times a day (QID) | ORAL | 0 refills | Status: DC

## 2016-06-24 MED ORDER — AMLODIPINE BESYLATE 5 MG PO TABS: 5.0000 mg | ORAL_TABLET | Freq: Every day | ORAL | 1 refills | Status: DC

## 2016-06-24 MED ORDER — OXYCODONE-ACETAMINOPHEN 5-325 MG PO TABS: 1.0000 | ORAL_TABLET | ORAL | 0 refills | Status: DC | PRN

## 2016-06-24 MED ORDER — SENNOSIDES-DOCUSATE SODIUM 8.6-50 MG PO TABS: 2.0000 | ORAL_TABLET | Freq: Every day | ORAL | 0 refills | Status: DC

## 2016-06-24 NOTE — Procedures (Signed)
     GYNECOLOGY CLINIC PROCEDURE NOTE  Deborah CroftBrooklyn D Wells is a 20 y.o. G1P1001 who is postpartum day #3, desiring Nexplanon insertion.    Nexplanon Insertion Procedure Patient identified, informed consent performed, consent signed.   Patient does understand that irregular bleeding is a very common side effect of this medication. She was advised to have backup contraception for one week after placement. Patient is postpartum day#3, still in hospital after cesarean section.  Appropriate time out taken.  Patient's left arm was prepped and draped in the usual sterile fashion.. The ruler used to measure and mark insertion area.  Patient was prepped with alcohol swab and then injected with 3 ml of 1% lidocaine.  She was prepped with betadine, Nexplanon removed from packaging,  Device confirmed in needle, then inserted full length of needle and withdrawn per handbook instructions. Nexplanon was able to palpated in the patient's arm; patient palpated the insert herself. There was minimal blood loss.  Patient insertion site covered with gauze and a pressure bandage to reduce any bruising.  The patient tolerated the procedure well and was given post procedure instructions and card for insertion date/removal with product information.   NEXPLANON Expir: 09/2018 Lot #: [id number] Inserted: 06/24/2016 Remove: 06/25/2019   Cleda ClarksELIZABETH W Heena Woodbury, DO OB FELLOW Center for [facility], [facility]

## 2016-06-24 NOTE — Lactation Note (Signed)
This note was copied from a baby's chart. Lactation Consultation Note  Patient Name: Deborah Wells ZOXWR'UToday's Date: 06/24/2016 Reason for consult: Follow-up assessment;NICU baby  NICU baby 3272 hours old. Mom reports that she is still pumping every 2-3 hours and her EBM volumes are increasing. Mom given additional bottles for collection and she is aware of pumping rooms in NICU with additional bottles in cabinets. Mom states that [facility], and she is aware of OP/BFSG and LC phone line assistance after D/C.   Maternal Data    Feeding Feeding Type: Formula Length of feed: 30 min  LATCH Score/Interventions                      Lactation Tools Discussed/Used Tools: Pump Breast pump type: Double-Electric Breast Pump   Consult Status Consult Status: PRN    Sherlyn HayJennifer D Jenan Ellegood 06/24/2016, 9:50 AM

## 2016-06-24 NOTE — Discharge Instructions (Signed)
Preeclampsia and Eclampsia  Preeclampsia is a serious condition that develops only during pregnancy. It is also called toxemia of pregnancy. This condition causes high blood pressure along with other symptoms, such as swelling and headaches. These symptoms may develop as the condition gets worse. Preeclampsia may occur at 20 weeks of pregnancy or later.  Diagnosing and treating preeclampsia early is very important. If not treated early, it can cause serious problems for you and your baby. One problem it can lead to is eclampsia, which is a condition that causes muscle jerking or shaking (convulsions or seizures) in the mother. Delivering your baby is the best treatment for preeclampsia or eclampsia. Preeclampsia and eclampsia symptoms usually go away after your baby is born.  What are the causes?  The cause of preeclampsia is not known.  What increases the risk?  The following risk factors make you more likely to develop preeclampsia:  · Being pregnant for the first time.  · Having had preeclampsia during a past pregnancy.  · Having a family history of preeclampsia.  · Having high blood pressure.  · Being pregnant with twins or triplets.  · Being 35 or older.  · Being African-American.  · Having kidney disease or diabetes.  · Having medical conditions such as lupus or blood diseases.  · Being very overweight (obese).    What are the signs or symptoms?  The earliest signs of preeclampsia are:  · High blood pressure.  · Increased protein in your urine. Your health care provider will check for this at every visit before you give birth (prenatal visit).    Other symptoms that may develop as the condition gets worse include:  · Severe headaches.  · Sudden weight gain.  · Swelling of the hands, face, legs, and feet.  · Nausea and vomiting.  · Vision problems, such as blurred or double vision.  · Numbness in the face, arms, legs, and feet.  · Urinating less than usual.  · Dizziness.  · Slurred speech.  · Abdominal pain,  especially upper abdominal pain.  · Convulsions or seizures.    Symptoms generally go away after giving birth.  How is this diagnosed?  There are no screening tests for preeclampsia. Your health care provider will ask you about symptoms and check for signs of preeclampsia during your prenatal visits. You may also have tests that include:  · Urine tests.  · Blood tests.  · Checking your blood pressure.  · Monitoring your baby’s heart rate.  · Ultrasound.    How is this treated?  You and your health care provider will determine the treatment approach that is best for you. Treatment may include:  · Having more frequent prenatal exams to check for signs of preeclampsia, if you have an increased risk for preeclampsia.  · Bed rest.  · Reducing how much salt (sodium) you eat.  · Medicine to lower your blood pressure.  · Staying in the hospital, if your condition is severe. There, treatment will focus on controlling your blood pressure and the amount of fluids in your body (fluid retention).  · You may need to take medicine (magnesium sulfate) to prevent seizures. This medicine may be given as an injection or through an IV tube.  · Delivering your baby early, if your condition gets worse. You may have your labor started with medicine (induced), or you may have a cesarean delivery.    Follow these instructions at home:  Eating and drinking     ·   quitting, ask your health care provider.  Do not use alcohol or drugs.  Avoid stress as much as possible. Rest and get plenty of sleep. General instructions  Take over-the-counter and prescription medicines only as told by your health  care provider.  When lying down, lie on your side. This keeps pressure off of your baby.  When sitting or lying down, raise (elevate) your feet. Try putting some pillows underneath your lower legs.  Exercise regularly. Ask your health care provider what kinds of exercise are best for you.  Keep all follow-up and prenatal visits as told by your health care provider. This is important. How is this prevented? To prevent preeclampsia or eclampsia from developing during another pregnancy:  Get proper medical care during pregnancy. Your health care provider may be able to prevent preeclampsia or diagnose and treat it early.  Your health care provider may have you take a low-dose aspirin or a calcium supplement during your next pregnancy.  You may have tests of your blood pressure and kidney function after giving birth.  Maintain a healthy weight. Ask your health care provider for help managing weight gain during pregnancy.  Work with your health care provider to manage any long-term (chronic) health conditions you have, such as diabetes or kidney problems. Contact a health care provider if:  You gain more weight than expected.  You have headaches.  You have nausea or vomiting.  You have abdominal pain.  You feel dizzy or light-headed. Get help right away if:  You develop sudden or severe swelling anywhere in your body. This usually happens in the legs.  You gain 5 lbs (2.3 kg) or more during one week.  You have severe:  Abdominal pain.  Headaches.  Dizziness.  Vision problems.  Confusion.  Nausea or vomiting.  You have a seizure.  You have trouble moving any part of your body.  You develop numbness in any part of your body.  You have trouble speaking.  You have any abnormal bleeding.  You pass out. This information is not intended to replace advice given to you by your health care provider. Make sure you discuss any questions you have with your health care  provider. Document Released: 07/01/2000 Document Revised: 03/01/2016 Document Reviewed: 02/08/2016 Elsevier Interactive Patient Education  2017 [employer] Inc[employer]  Incision Care, Adult An incision is a cut that a doctor makes in your skin for surgery (for a procedure). Most times, these cuts are closed after surgery. Your cut from surgery may be closed with stitches (sutures), staples, skin glue, or skin tape (adhesive strips). You may need to return to your doctor to have stitches or staples taken out. This may happen many days or many weeks after your surgery. The cut needs to be well cared for so it does not get infected. How to care for your cut Cut care  Follow instructions from your doctor about how to take care of your cut. Make sure you:  Wash your hands with soap and water before you change your bandage (dressing). If you cannot use soap and water, use hand sanitizer.  Change your bandage as told by your doctor.  Leave stitches, skin glue, or skin tape in place. They may need to stay in place for 2 weeks or longer. If tape strips get loose and curl up, you may trim the loose edges. Do not remove tape strips completely unless your doctor says it is okay.  Check your cut area every day for signs of infection.  Check for:  More redness, swelling, or pain.  More fluid or blood.  Warmth.  Pus or a bad smell.  Ask your doctor how to clean the cut. This may include:  Using mild soap and water.  Using a clean towel to pat the cut dry after you clean it.  Putting a cream or ointment on the cut. Do this only as told by your doctor.  Covering the cut with a clean bandage.  Ask your doctor when you can leave the cut uncovered.  Do not take baths, swim, or use a hot tub until your doctor says it is okay. Ask your doctor if you can take showers. You may only be allowed to take sponge baths for bathing. Medicines  If you were prescribed an antibiotic medicine, cream, or ointment, take  the antibiotic or put it on the cut as told by your doctor. Do not stop taking or putting on the antibiotic even if your condition gets better.  Take over-the-counter and prescription medicines only as told by your doctor. General instructions  Limit movement around your cut. This helps healing.  Avoid straining, lifting, or exercise for the first month, or for as long as told by your doctor.  Follow instructions from your doctor about going back to your normal activities.  Ask your doctor what activities are safe.  Protect your cut from the sun when you are outside for the first 6 months, or for as long as told by your doctor. Put on sunscreen around the scar or cover up the scar.  Keep all follow-up visits as told by your doctor. This is important. Contact a doctor if:  Your have more redness, swelling, or pain around the cut.  You have more fluid or blood coming from the cut.  Your cut feels warm to the touch.  You have pus or a bad smell coming from the cut.  You have a fever or shaking chills.  You feel sick to your stomach (nauseous) or you throw up (vomit).  You are dizzy.  Your stitches or staples come undone. Get help right away if:  You have a red streak coming from your cut.  Your cut bleeds through the bandage and the bleeding does not stop with gentle pressure.  The edges of your cut open up and separate.  You have very bad (severe) pain.  You have a rash.  You are confused.  You pass out (faint).  You have trouble breathing and you have a fast heartbeat. This information is not intended to replace advice given to you by your health care provider. Make sure you discuss any questions you have with your health care provider. Document Released: 09/26/2011 Document Revised: 03/11/2016 Document Reviewed: 03/11/2016 Elsevier Interactive Patient Education  2017 Else[employer] Inc.   Cesarean Delivery, Care After Refer to this sheet in the next few weeks. These  instructions provide you with information about caring for yourself after your procedure. Your health care provider may also give you more specific instructions. Your treatment has been planned according to current medical practices, but problems sometimes occur. Call your health care provider if you have any problems or questions after your procedure. What can I expect after the procedure? After the procedure, it is common to have:  A small amount of blood or clear fluid coming from the incision.  Some redness, swelling, and pain in your incision area.  Some abdominal pain and soreness.  Vaginal bleeding (lochia).  Pelvic cramps.  Fatigue. Follow  these instructions at home: Incision care  Follow instructions from your health care provider about how to take care of your incision. Make sure you:  Wash your hands with soap and water before you change your bandage (dressing). If soap and water are not available, use hand sanitizer.  Change your dressing as told by your health care provider.  Leave stitches (sutures), skin staples, skin glue, or adhesive strips in place. These skin closures may need to stay in place for 2 weeks or longer. If adhesive strip edges start to loosen and curl up, you may trim the loose edges. Do not remove adhesive strips completely unless your health care provider tells you to do that.  Check your incision area every day for signs of infection. Check for:  More redness, swelling, or pain.  More fluid or blood.  Warmth.  Pus or a bad smell.  When you cough or sneeze, hug a pillow. This helps with pain and decreases the chance of your incision opening up (dehiscing). Do this until your incision heals. Medicines  Take over-the-counter and prescription medicines only as told by your health care provider.  If you were prescribed an antibiotic medicine, take it as told by your health care provider. Do not stop taking the antibiotic until it is  finished. Driving  Do not drive or operate heavy machinery while taking prescription pain medicine.  Do not drive for 24 hours if you received a sedative. Lifestyle  Do not drink alcohol. This is especially important if you are breastfeeding or taking pain medicine.  Do not use tobacco products, including cigarettes, chewing tobacco, or e-cigarettes. If you need help quitting, ask your health care provider. Tobacco can delay wound healing. Eating and drinking  Drink at least 8 eight-ounce glasses of water every day unless told not to by your health care provider. If you breastfeed, you may need to drink more water than this.  Eat high-fiber foods every day. These foods may help prevent or relieve constipation. High-fiber foods include:  Whole grain cereals and breads.  Brown rice.  Beans.  Fresh fruits and vegetables. Activity  Return to your normal activities as told by your health care provider. Ask your health care provider what activities are safe for you.  Rest as much as possible. Try to rest or take a nap while your baby is sleeping.  Do not lift anything that is heavier than your baby or 10 lb (4.5 kg) as told by your health care provider.  Ask your health care provider when you can engage in sexual activity. This may depend on your:  Risk of infection.  Healing rate.  Comfort and desire to engage in sexual activity. Bathing  Do not take baths, swim, or use a hot tub until your health care provider approves. Ask your health care provider if you can take showers. You may only be allowed to take sponge baths until your incision heals.  Keep your dressing dry as told by your health care provider. General instructions  Do not use tampons or douches until your health care provider approves.  Wear:  Loose, comfortable clothing.  A supportive and well-fitting bra.  Watch for any blood clots that may pass from your vagina. These may look like clumps of dark red,  brown, or black discharge.  Keep your perineum clean and dry as told by your health care provider.  Wipe from front to back when you use the toilet.  If possible, have someone help you care  for your baby and help with household activities for a few days after you leave the hospital.  Keep all follow-up visits for you and your baby as told by your health care provider. This is important. Contact a health care provider if:  You have:  Bad-smelling vaginal discharge.  Difficulty urinating.  Pain when urinating.  A sudden increase or decrease in the frequency of your bowel movements.  More redness, swelling, or pain around your incision.  More fluid or blood coming from your incision.  Pus or a bad smell coming from your incision.  A fever.  A rash.  Little or no interest in activities you used to enjoy.  Questions about caring for yourself or your baby.  Nausea.  Your incision feels warm to the touch.  Your breasts turn red or become painful or hard.  You feel unusually sad or worried.  You vomit.  You pass large blood clots from your vagina. If you pass a blood clot, save it to show to your health care provider. Do not flush blood clots down the toilet without showing your health care provider.  You urinate more than usual.  You are dizzy or light-headed.  You have not breastfed and have not had a menstrual period for 12 weeks after delivery.  You stopped breastfeeding and have not had a menstrual period for 12 weeks after stopping breastfeeding. Get help right away if:  You have:  Pain that does not go away or get better with medicine.  Chest pain.  Difficulty breathing.  Blurred vision or spots in your vision.  Thoughts about hurting yourself or your baby.  New pain in your abdomen or in one of your legs.  A severe headache.  You faint.  You bleed from your vagina so much that you fill two sanitary pads in one hour. This information is not  intended to replace advice given to you by your health care provider. Make sure you discuss any questions you have with your health care provider. Document Released: 03/26/2002 Document Revised: 11/12/2015 Document Reviewed: 06/08/2015 Elsevier Interactive Patient Education  2017 [employer]

## 2016-06-24 NOTE — Progress Notes (Signed)
CSW acknowledges NICU admission.    Patient screened out for psychosocial assessment since none of the following apply:  Psychosocial stressors documented in mother or baby's chart  Gestation less than 32 weeks  Code at delivery   Infant with anomalies  Please contact the Clinical Social Worker if specific needs arise, or by MOB's request.       

## 2016-06-24 NOTE — Discharge Summary (Signed)
OB Discharge Summary     Patient Name: Deborah CroftBrooklyn D Wells DOB: 11-09-1995 MRN: [id number]  Date of admission: 06/19/2016 Delivering MD: Jaynie CollinsANYANWU, UGONNA A   Date of discharge: 06/24/2016  Admitting diagnosis: 36wk back hurts hands swelling  Intrauterine pregnancy: 5847w0d     Secondary diagnosis:  Principal Problem:   S/P emergency cesarean section for fetal bradycardia Active Problems:   Gestational hypertension   S/P cesarean section  Additional problems: None     Discharge diagnosis: Term Pregnancy Delivered, Preeclampsia (severe) and Teen pregnancy                                                                                                Post partum procedures:Nexplanon insertion  Augmentation: Cytotec and Foley Balloon  Complications: None  Hospital course:  Induction of Labor With Cesarean Section  20 y.o. yo G1P1001 at 6447w0d was admitted to the hospital 06/19/2016 for new onset gestational hypertension and possible preeclampsia, as well as preterm labor. Patient had a labor course significant for attempted induction of labor with cytotec and foley bulb. Fetal HR became bradycardic without response to terbutaline, was taken back for stat cesarean. The patient went for cesarean section due to Non-Reassuring FHR, and delivered a Viable infant,@BABYSUPPRESS (DBLINK,ept,110,,1,,) Membrane Rupture Time/Date: )8:52 AM ,06/21/2016   @Details  of operation can be found in separate operative Note.  Patient had an uncomplicated postpartum course. She is ambulating, tolerating a regular diet, passing flatus, and urinating well.  Patient is discharged home in stable condition on 06/24/16.                                     Physical exam Vitals:   06/23/16 1954 06/23/16 2352 06/24/16 0855 06/24/16 1200  BP:  (!) 141/86 119/71 137/81  Pulse:  (!) 101 94 (!) 101  Resp: 16 16 16 16   Temp: 98.2 F (36.8 C) 98 F (36.7 C) 98 F (36.7 C) 98.1 F (36.7 C)  TempSrc: Oral Oral Oral Oral   SpO2:  99% 98% 99%  Weight:      Height:       General: alert, cooperative and no distress Lochia: appropriate Uterine Fundus: firm Incision: Healing well with no significant drainage, No significant erythema, Dressing is clean, dry, and intact DVT Evaluation: No evidence of DVT seen on physical exam. Negative Homan's sign. No cords or calf tenderness. No significant calf/ankle edema. Labs: Lab Results  Component Value Date   WBC 18.2 (H) 06/22/2016   HGB 8.6 (L) 06/22/2016   HCT 25.2 (L) 06/22/2016   MCV 80.0 06/22/2016   PLT 255 06/22/2016   CMP Latest Ref Rng & Units 06/22/2016  Glucose 65 - 99 mg/dL 782(N136(H)  BUN 6 - 20 mg/dL 7  Creatinine 5.620.44 - 1.301.00 mg/dL 8.650.73  Sodium 784135 - 696145 mmol/L 133(L)  Potassium 3.5 - 5.1 mmol/L 3.8  Chloride 101 - 111 mmol/L 103  CO2 22 - 32 mmol/L 25  Calcium 8.9 - 10.3 mg/dL 7.1(L)  Total Protein 6.5 - 8.1 g/dL  5.3(L)  Total Bilirubin 0.3 - 1.2 mg/dL 1.6(X0.2(L)  Alkaline Phos 38 - 126 U/L 121  AST 15 - 41 U/L 19  ALT 14 - 54 U/L 13(L)    Discharge instruction: per After Visit Summary and "Baby and Me Booklet".  After visit meds:    Medication List    TAKE these medications   amLODipine 5 MG tablet Commonly known as:  NORVASC Take 1 tablet (5 mg total) by mouth daily. Start taking on:  06/25/2016   ibuprofen 600 MG tablet Commonly known as:  ADVIL,MOTRIN Take 1 tablet (600 mg total) by mouth every 6 (six) hours.   oxyCODONE-acetaminophen 5-325 MG tablet Commonly known as:  PERCOCET/ROXICET Take 1 tablet by mouth every 4 (four) hours as needed for severe pain.   senna-docusate 8.6-50 MG tablet Commonly known as:  Senokot-S Take 2 tablets by mouth at bedtime.       Diet: low salt diet  Activity: Advance as tolerated. Pelvic rest for 6 weeks.   Outpatient follow up:1 week for BP recheck; 6 weeks postpartum visit. Follow up Appt:No future appointments. Follow up Visit:No Follow-up on file.  Postpartum contraception:  Nexplanon inserted on day of discharge  Newborn Data: Live born female  Birth Weight: 4 lb 11.8 oz (2150 g) APGAR: 1, 7  Baby Feeding: Bottle and Breast Disposition:NICU   06/24/2016 Deborah MowElizabeth Green Quin[place], DO  OB Fellow

## 2016-06-30 ENCOUNTER — Encounter: Payer: Self-pay | Admitting: Advanced Practice Midwife

## 2016-07-21 ENCOUNTER — Inpatient Hospital Stay (HOSPITAL_COMMUNITY)
Admission: AD | Admit: 2016-07-21 | Discharge: 2016-07-21 | Disposition: A | Discharge status: Home / Self Care | Payer: Medicaid Other | Source: Ambulatory Visit | Attending: Obstetrics & Gynecology | Admitting: Obstetrics & Gynecology

## 2016-07-21 DIAGNOSIS — J069 Acute upper respiratory infection, unspecified: Secondary | ICD-10-CM | POA: Diagnosis not present

## 2016-07-21 DIAGNOSIS — G8918 Other acute postprocedural pain: Secondary | ICD-10-CM | POA: Insufficient documentation

## 2016-07-21 DIAGNOSIS — O9089 Other complications of the puerperium, not elsewhere classified: Secondary | ICD-10-CM | POA: Insufficient documentation

## 2016-07-21 DIAGNOSIS — R109 Unspecified abdominal pain: Secondary | ICD-10-CM | POA: Insufficient documentation

## 2016-07-21 MED ORDER — OXYCODONE-ACETAMINOPHEN 5-325 MG PO TABS
1.0000 | ORAL_TABLET | Freq: Four times a day (QID) | ORAL | 0 refills | Status: DC | PRN
Start: 2016-07-21 — End: 2016-09-28

## 2016-07-21 NOTE — Discharge Instructions (Signed)
For your upper respiratory infection/cold: Try Robitussin with dexomethorphan for cough suppression, Mucinex for chest congestion, and/or Sudafed for nasal congestion.  Also try to drink more fluids, gargle with salt water, take warm bath/shower to clear congestion.  Incision Care, Adult An incision is a surgical cut that is made through your skin. Most incisions are closed after surgery. Your incision may be closed with stitches (sutures), staples, skin glue, or adhesive strips. You may need to return to your health care provider to have sutures or staples removed. This may occur several days to several weeks after your surgery. The incision needs to be cared for properly to prevent infection. How to care for your incision Incision care   Follow instructions from your health care provider about how to take care of your incision. Make sure you:  Wash your hands with soap and water before you change the bandage (dressing). If soap and water are not available, use hand sanitizer.  Change your dressing as told by your health care provider.  Leave sutures, skin glue, or adhesive strips in place. These skin closures may need to stay in place for 2 weeks or longer. If adhesive strip edges start to loosen and curl up, you may trim the loose edges. Do not remove adhesive strips completely unless your health care provider tells you to do that.  Check your incision area every day for signs of infection. Check for:  More redness, swelling, or pain.  More fluid or blood.  Warmth.  Pus or a bad smell.  Ask your health care provider how to clean the incision. This may include:  Using mild soap and water.  Using a clean towel to pat the incision dry after cleaning it.  Applying a cream or ointment. Do this only as told by your health care provider.  Covering the incision with a clean dressing.  Ask your health care provider when you can leave the incision uncovered.  Do not take baths, swim,  or use a hot tub until your health care provider approves. Ask your health care provider if you can take showers. You may only be allowed to take sponge baths for bathing. Medicines  If you were prescribed an antibiotic medicine, cream, or ointment, take or apply the antibiotic as told by your health care provider. Do not stop taking or applying the antibiotic even if your condition improves.  Take over-the-counter and prescription medicines only as told by your health care provider. General instructions  Limit movement around your incision to improve healing.  Avoid straining, lifting, or exercise for the first month, or for as long as told by your health care provider.  Follow instructions from your health care provider about returning to your normal activities.  Ask your health care provider what activities are safe.  Protect your incision from the sun when you are outside for the first 6 months, or for as long as told by your health care provider. Apply sunscreen around the scar or cover it up.  Keep all follow-up visits as told by your health care provider. This is important. Contact a health care provider if:  Your have more redness, swelling, or pain around the incision.  You have more fluid or blood coming from the incision.  Your incision feels warm to the touch.  You have pus or a bad smell coming from the incision.  You have a fever or shaking chills.  You are nauseous or you vomit.  You are dizzy.  Your sutures  or staples come undone. Get help right away if:  You have a red streak coming from your incision.  Your incision bleeds through the dressing and the bleeding does not stop with gentle pressure.  The edges of your incision open up and separate.  You have severe pain.  You have a rash.  You are confused.  You faint.  You have trouble breathing and a fast heartbeat. This information is not intended to replace advice given to you by your health care  provider. Make sure you discuss any questions you have with your health care provider. Document Released: 01/21/2005 Document Revised: 03/11/2016 Document Reviewed: 01/20/2016 Elsevier Interactive Patient Education  2017 [employer].

## 2016-07-21 NOTE — MAU Note (Signed)
Patient states presents with c/o c section scar hurting when she coughs and at night. Patient delivered on December 5th.

## 2016-07-21 NOTE — MAU Provider Note (Signed)
Chief Complaint: Abdominal Pain   First Provider Initiated Contact with Patient 07/21/16 1659      SUBJECTIVE HPI: Deborah Wells is a 21 y.o. G1P1001 s/p PLTCS on 06/21/16 who presents to maternity admissions reporting onset of incision pain yesterday, mostly at night and when coughing. She reports scratchy throat and cough started yesterday too, but denies any nasal or chest congestion today. She denies fever/chills.  She reports her pain after her c/s completely resolved 2 weeks ago and she has felt great but started coughing yesterday and the pain started in her low abdomen, in the middle of the incision.  She has not taken any medication or tried any treatments. She denies vaginal bleeding, vaginal itching/burning, urinary symptoms, h/a, dizziness, n/v, or fever/chills.     HPI  Past Medical History:  Diagnosis Date  . Medical history non-contributory    Past Surgical History:  Procedure Laterality Date  . CESAREAN SECTION N/A 06/21/2016   Procedure: CESAREAN SECTION;  Surgeon: Tereso NewcomerUgonna A Anyanwu, MD;  Location: [facility];  Service: Obstetrics;  Laterality: N/A;  . INSERTION OF NON VAGINAL CONTRACEPTIVE DEVICE  06/24/2016      . NO PAST SURGERIES     Social History   Social History  . Marital status: Single    Spouse name: N/A  . Number of children: N/A  . Years of education: N/A   Occupational History  . Not on file.   Social History Main Topics  . Smoking status: Never Smoker  . Smokeless tobacco: Never Used  . Alcohol use No  . Drug use: No  . Sexual activity: Yes    Birth control/ protection: None   Other Topics Concern  . Not on file   Social History Narrative   ** Merged History Encounter **       No current facility-administered medications on file prior to encounter.    Current Outpatient Prescriptions on File Prior to Encounter  Medication Sig Dispense Refill  . amLODipine (NORVASC) 5 MG tablet Take 1 tablet (5 mg total) by mouth daily. 30  tablet 1  . ibuprofen (ADVIL,MOTRIN) 600 MG tablet Take 1 tablet (600 mg total) by mouth every 6 (six) hours. 30 tablet 0   No Known Allergies  ROS:  Review of Systems  Constitutional: Negative for chills, fatigue and fever.  Respiratory: Negative for shortness of breath.   Cardiovascular: Negative for chest pain.  Gastrointestinal: Positive for abdominal pain.       Pain at incision site only  Genitourinary: Negative for difficulty urinating, dysuria, flank pain, pelvic pain, vaginal bleeding, vaginal discharge and vaginal pain.  Skin: Positive for wound.       Pain at center of C/S incision  Neurological: Negative for dizziness and headaches.  Psychiatric/Behavioral: Negative.      I have reviewed patient's Past Medical Hx, Surgical Hx, Family Hx, Social Hx, medications and allergies.   Physical Exam  Patient Vitals for the past 24 hrs:  BP Temp Temp src Pulse Resp  07/21/16 1736 - - - - 18  07/21/16 1635 124/78 98.5 F (36.9 C) Oral 89 18   Constitutional: Well-developed, well-nourished female in no acute distress.  Cardiovascular: normal rate Respiratory: normal effort GI: Abd soft, non-tender. Pos BS x 4 MS: Extremities nontender, no edema, normal ROM Neurologic: Alert and oriented x 4.  GU: Neg CVAT. Skin: incision well-approximated with no edema, erythema, or exudate.  Small approximately 1 x 1 x 2 cm hard, non-fluctuant, non-mobile area palpable  immediately behind/above center of low transverse incision    LAB RESULTS No results found for this or any previous visit (from the past 24 hour(s)).  --/--/O POS (12/03 1415)  IMAGING No results found.  MAU Management/MDM: Likely pain at incision related to coughing.  Incision healing well.  Small indurated area c/w scar/keloid.  No evidence of infection.  Will treat pain with Percocet 5/325, take 1-2 Q 6 hours x 10 tabs, ibuprofen OTC, and OTC cough suppressant (pt given list of safe OTC meds for breastfeeding).  Pt  to f/u in MAU or WOC if pain persists more than 2-3 days, or worsens, or any signs of infection.  Pt stable at time of discharge.  ASSESSMENT 1. Postoperative pain   2. Postoperative complication of cesarean section   3. Acute upper respiratory infection     PLAN Discharge home  Allergies as of 07/21/2016   No Known Allergies     Medication List    STOP taking these medications   senna-docusate 8.6-50 MG tablet Commonly known as:  Senokot-S     TAKE these medications   amLODipine 5 MG tablet Commonly known as:  NORVASC Take 1 tablet (5 mg total) by mouth daily.   ibuprofen 600 MG tablet Commonly known as:  ADVIL,MOTRIN Take 1 tablet (600 mg total) by mouth every 6 (six) hours.   NEXPLANON 68 MG Impl implant Generic drug:  etonogestrel 1 each by Subdermal route once.   oxyCODONE-acetaminophen 5-325 MG tablet Commonly known as:  PERCOCET/ROXICET Take 1-2 tablets by mouth every 6 (six) hours as needed for severe pain. What changed:  how much to take  when to take this      Follow-up Information    [facility].   Specialty:  Obstetrics and Gynecology Why:  As scheduled or sooner as needed. Return to MAU as needed for emergencies Contact information: [address] [place] [address] [phone]          Sharen Counter Certified Nurse-Midwife 07/21/2016  5:38 PM

## 2016-08-03 ENCOUNTER — Ambulatory Visit: Payer: Medicaid Other | Admitting: Advanced Practice Midwife

## 2016-08-24 ENCOUNTER — Encounter: Payer: Self-pay | Admitting: *Deleted

## 2016-08-24 ENCOUNTER — Ambulatory Visit: Payer: Medicaid Other | Admitting: Obstetrics and Gynecology

## 2016-08-24 NOTE — Progress Notes (Signed)
Swathi did not keep a scheduled postpartum appointment. Per chart review has gestational HtN. Was seen in MAU 07/21/16 about a month aftter delivery for pain in incision -but incision fine and pain thought to be due to coughing from URI.  BP also wnl, Per discussion with provider No need to call patient to reschedule.

## 2016-09-28 ENCOUNTER — Ambulatory Visit (HOSPITAL_COMMUNITY)
Admission: EM | Admit: 2016-09-28 | Discharge: 2016-09-28 | Disposition: A | Discharge status: Home / Self Care | Payer: Medicaid Other | Attending: Internal Medicine | Admitting: Internal Medicine

## 2016-09-28 ENCOUNTER — Encounter (HOSPITAL_COMMUNITY): Payer: Self-pay | Admitting: *Deleted

## 2016-09-28 DIAGNOSIS — H60333 Swimmer's ear, bilateral: Secondary | ICD-10-CM

## 2016-09-28 HISTORY — DX: Essential (primary) hypertension: I10

## 2016-09-28 MED ORDER — CIPROFLOXACIN-HYDROCORTISONE 0.2-1 % OT SUSP: 3.0000 [drp] | Freq: Two times a day (BID) | OTIC | 0 refills | Status: DC

## 2016-09-28 NOTE — ED Triage Notes (Signed)
Pt  Reports   Both  Ears  Feel  Like  They  Are  Closing  Up  X  2   Weeks  Ago        Pt   Sitting  Upright on  The  Exam table  Speaking in   compte  sentances  In no  Acute  Severe   Distress

## 2016-09-28 NOTE — Discharge Instructions (Signed)
You have otitis externa, otherwise known as swimmer's ear. I have prescribed Cipro eardrops. Placed 3 drops in each ear twice a day. He may take over-the-counter ibuprofen, or Tylenol for pain. Do not put any Q-tips, or other objects in your ear. Also keep your ears dry. If your symptoms are not substantially better in one week, return to clinic.

## 2016-09-28 NOTE — ED Provider Notes (Signed)
CSN: [id number]     Arrival date & time 09/28/16  1007 History   First MD Initiated Contact with Patient 09/28/16 1136     Chief Complaint  Patient presents with  . Otalgia   (Consider location/radiation/quality/duration/timing/severity/associated sxs/prior Treatment) 21 year old female presents with a 2 week history of bilateral ear pain, ear pressure, and decreased hearing. She reports she's been trying to clean her ears out with Q-tips, however symptoms continue to worsen. She has no dizziness, no recent history of congestion, no recent history of URI, she has no cough, no chest pain, no chest tightness, no shortness of breath, no weakness, dizziness, nausea, vomiting, diarrhea, abdominal pain, dysuria, skin rashes or lesions. She's had no fever or chills either. Social history is negative for smoking, negative for alcohol, she denies illegal drug use, past medical history and family history reviewed and is noncontributory.      Past Medical History:  Diagnosis Date  . Hypertension   . Medical history non-contributory    Past Surgical History:  Procedure Laterality Date  . CESAREAN SECTION N/A 06/21/2016   Procedure: CESAREAN SECTION;  Surgeon: Tereso Newcomer, MD;  Location: [facility];  Service: Obstetrics;  Laterality: N/A;  . INSERTION OF NON VAGINAL CONTRACEPTIVE DEVICE  06/24/2016      . NO PAST SURGERIES     Family History  Problem Relation Age of Onset  . Diabetes Mother   . Cancer Maternal Grandmother    Social History  Substance Use Topics  . Smoking status: Never Smoker  . Smokeless tobacco: Never Used  . Alcohol use No   OB History    Gravida Para Term Preterm AB Living   1 1 1     1    SAB TAB Ectopic Multiple Live Births         0 1     Review of Systems  Reason unable to perform ROS: as covered in HPI.  All other systems reviewed and are negative.   Allergies  Patient has no known allergies.  Home Medications   Prior to Admission  medications   Medication Sig Start Date End Date Taking? Authorizing Provider  amLODipine (NORVASC) 5 MG tablet Take 1 tablet (5 mg total) by mouth daily. 06/25/16   Hiram Comber Mumaw, DO  ciprofloxacin-hydrocortisone (CIPRO [facility]) otic suspension Place 3 drops into both ears 2 (two) times daily. 09/28/16   Dorena Bodo, NP  etonogestrel (NEXPLANON) 68 MG IMPL implant 1 each by Subdermal route once.    Historical Provider, MD   Meds Ordered and Administered this Visit  Medications - No data to display  BP 126/89 (BP Location: Right Arm)   Pulse 82   Temp 98.7 F (37.1 C) (Oral)   Resp 18   SpO2 100%   Breastfeeding? No Comment: implant No data found.   Physical Exam  Constitutional: She is oriented to person, place, and time. She appears well-developed and well-nourished. No distress.  HENT:  Head: Normocephalic and atraumatic.  Right Ear: External ear normal. There is swelling and tenderness. No drainage. No mastoid tenderness.  Left Ear: External ear normal. There is swelling and tenderness. No drainage. No mastoid tenderness.  Swelling noted to both ears, a greenish to yellow discharge noted within each ear, there is no mastoid tenderness, no redness, no swelling on the exterior of the ear.  Eyes: EOM are normal. Pupils are equal, round, and reactive to light.  Neck: Normal range of motion. Neck supple.  Cardiovascular: Normal rate and  regular rhythm.   Pulmonary/Chest: Effort normal and breath sounds normal.  Musculoskeletal: She exhibits no edema or tenderness.  Lymphadenopathy:       Head (right side): No submental, no submandibular, no tonsillar and no preauricular adenopathy present.       Head (left side): No submental, no submandibular, no tonsillar and no preauricular adenopathy present.    She has no cervical adenopathy.  Neurological: She is alert and oriented to person, place, and time.  Skin: Skin is warm and dry. Capillary refill takes less than 2 seconds.  She is not diaphoretic.  Psychiatric: She has a normal mood and affect. Her behavior is normal.  Nursing note and vitals reviewed.   Urgent Care Course     Procedures (including critical care time)  Labs Review Labs Reviewed - No data to display  Imaging Review No results found.     MDM   1. Acute swimmer's ear of both sides    Patient given prescription for Cipro eardrops. Advised to return to clinic in one week should her symptoms fail to improve, no to the emergency room at any times if her symptoms worsen, or if she shows symptoms of systemic disease or worsening of her infection     Dorena BodoLawrence Lynett Brasil, NP 09/28/16 1152

## 2017-01-31 ENCOUNTER — Encounter (HOSPITAL_COMMUNITY): Payer: Self-pay | Admitting: Emergency Medicine

## 2017-01-31 ENCOUNTER — Emergency Department (HOSPITAL_COMMUNITY)
Admission: EM | Admit: 2017-01-31 | Discharge: 2017-01-31 | Disposition: A | Discharge status: Home / Self Care | Payer: Medicaid Other | Attending: Emergency Medicine | Admitting: Emergency Medicine

## 2017-01-31 DIAGNOSIS — Y999 Unspecified external cause status: Secondary | ICD-10-CM | POA: Diagnosis not present

## 2017-01-31 DIAGNOSIS — Y93I9 Activity, other involving external motion: Secondary | ICD-10-CM | POA: Insufficient documentation

## 2017-01-31 DIAGNOSIS — Y92410 Unspecified street and highway as the place of occurrence of the external cause: Secondary | ICD-10-CM | POA: Insufficient documentation

## 2017-01-31 DIAGNOSIS — S3992XA Unspecified injury of lower back, initial encounter: Secondary | ICD-10-CM | POA: Diagnosis present

## 2017-01-31 DIAGNOSIS — S7001XA Contusion of right hip, initial encounter: Secondary | ICD-10-CM | POA: Diagnosis not present

## 2017-01-31 DIAGNOSIS — I10 Essential (primary) hypertension: Secondary | ICD-10-CM | POA: Diagnosis not present

## 2017-01-31 DIAGNOSIS — Z79899 Other long term (current) drug therapy: Secondary | ICD-10-CM | POA: Diagnosis not present

## 2017-01-31 MED ORDER — NAPROXEN 500 MG PO TABS: 500.0000 mg | ORAL_TABLET | Freq: Two times a day (BID) | ORAL | 0 refills | Status: DC
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
## 2017-01-31 NOTE — Discharge Instructions (Signed)
Apply ice pack to the right lower back and hip. Avoid any strenuous activity. Naproxen for pain and inflammation as prescribed. Follow-up in 5-7 days with your family doctor if not improving for imaging and further treatment.
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
## 2017-01-31 NOTE — ED Notes (Signed)
ED Provider at bedside. 

## 2017-03-23 ENCOUNTER — Emergency Department (HOSPITAL_COMMUNITY)
Admission: EM | Admit: 2017-03-23 | Discharge: 2017-03-23 | Disposition: A | Discharge status: Home / Self Care | Payer: No Typology Code available for payment source | Attending: Emergency Medicine | Admitting: Emergency Medicine

## 2017-03-23 ENCOUNTER — Encounter (HOSPITAL_COMMUNITY): Payer: Self-pay | Admitting: Emergency Medicine

## 2017-03-23 DIAGNOSIS — Y9389 Activity, other specified: Secondary | ICD-10-CM | POA: Diagnosis not present

## 2017-03-23 DIAGNOSIS — Z79899 Other long term (current) drug therapy: Secondary | ICD-10-CM | POA: Insufficient documentation

## 2017-03-23 DIAGNOSIS — I10 Essential (primary) hypertension: Secondary | ICD-10-CM | POA: Insufficient documentation

## 2017-03-23 DIAGNOSIS — M791 Myalgia: Secondary | ICD-10-CM | POA: Insufficient documentation

## 2017-03-23 DIAGNOSIS — M7918 Myalgia, other site: Secondary | ICD-10-CM

## 2017-03-23 DIAGNOSIS — Y999 Unspecified external cause status: Secondary | ICD-10-CM | POA: Insufficient documentation

## 2017-03-23 DIAGNOSIS — Y92410 Unspecified street and highway as the place of occurrence of the external cause: Secondary | ICD-10-CM | POA: Diagnosis not present

## 2017-03-23 MED ORDER — IBUPROFEN 200 MG PO TABS
600.0000 mg | ORAL_TABLET | Freq: Once | ORAL | Status: AC
  Administered 2017-03-23: 600 mg via ORAL
  Filled 2017-03-23: qty 3

## 2017-03-23 MED ORDER — IBUPROFEN 600 MG PO TABS: 600.0000 mg | ORAL_TABLET | Freq: Four times a day (QID) | ORAL | 0 refills | Status: DC | PRN

## 2017-03-23 MED ORDER — ACETAMINOPHEN 325 MG PO TABS
650.0000 mg | ORAL_TABLET | Freq: Once | ORAL | Status: AC
  Administered 2017-03-23: 650 mg via ORAL
  Filled 2017-03-23: qty 2
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
## 2017-10-06 ENCOUNTER — Emergency Department (HOSPITAL_COMMUNITY)
Admission: EM | Admit: 2017-10-06 | Discharge: 2017-10-07 | Disposition: A | Discharge status: Home / Self Care | Payer: Medicaid Other | Attending: Emergency Medicine | Admitting: Emergency Medicine

## 2017-10-06 ENCOUNTER — Emergency Department (HOSPITAL_COMMUNITY): Payer: Medicaid Other

## 2017-10-06 ENCOUNTER — Other Ambulatory Visit: Payer: Self-pay

## 2017-10-06 DIAGNOSIS — R0602 Shortness of breath: Secondary | ICD-10-CM | POA: Insufficient documentation

## 2017-10-06 DIAGNOSIS — J029 Acute pharyngitis, unspecified: Secondary | ICD-10-CM | POA: Diagnosis not present

## 2017-10-06 DIAGNOSIS — Z79899 Other long term (current) drug therapy: Secondary | ICD-10-CM | POA: Diagnosis not present

## 2017-10-06 DIAGNOSIS — I10 Essential (primary) hypertension: Secondary | ICD-10-CM | POA: Diagnosis not present

## 2017-10-06 DIAGNOSIS — R509 Fever, unspecified: Secondary | ICD-10-CM | POA: Insufficient documentation

## 2017-10-06 DIAGNOSIS — J111 Influenza due to unidentified influenza virus with other respiratory manifestations: Secondary | ICD-10-CM

## 2017-10-06 DIAGNOSIS — R05 Cough: Secondary | ICD-10-CM | POA: Insufficient documentation

## 2017-10-06 LAB — URINALYSIS, ROUTINE W REFLEX MICROSCOPIC
BILIRUBIN URINE: NEGATIVE
GLUCOSE, UA: NEGATIVE mg/dL
Hgb urine dipstick: NEGATIVE
KETONES UR: NEGATIVE mg/dL
Leukocytes, UA: NEGATIVE
Nitrite: NEGATIVE
PH: 8 (ref 5.0–8.0)
Protein, ur: NEGATIVE mg/dL
SPECIFIC GRAVITY, URINE: 1.017 (ref 1.005–1.030)

## 2017-10-06 LAB — INFLUENZA PANEL BY PCR (TYPE A & B)
Influenza A By PCR: NEGATIVE
Influenza B By PCR: NEGATIVE

## 2017-10-06 LAB — I-STAT BETA HCG BLOOD, ED (MC, WL, AP ONLY): I-stat hCG, quantitative: <5 m[IU]/mL (ref <5)

## 2017-10-06 MED ORDER — SODIUM CHLORIDE 0.9 % IV BOLUS (SEPSIS)
1000.0000 mL | Freq: Once | INTRAVENOUS | Status: AC
  Administered 2017-10-06: 1000 mL via INTRAVENOUS

## 2017-10-06 MED ORDER — ACETAMINOPHEN-CODEINE 120-12 MG/5ML PO SOLN: 10.0000 mL | ORAL | 0 refills | Status: DC | PRN

## 2017-10-06 MED ORDER — IBUPROFEN 800 MG PO TABS: 800.0000 mg | ORAL_TABLET | Freq: Three times a day (TID) | ORAL | 0 refills | Status: DC | PRN

## 2017-10-06 MED ORDER — SODIUM CHLORIDE 0.9 % IV BOLUS (SEPSIS)
1000.0000 mL | Freq: Once | INTRAVENOUS | Status: AC
Start: 2017-10-06 — End: 2017-10-06
  Administered 2017-10-06: 1000 mL via INTRAVENOUS

## 2017-10-06 MED ORDER — GUAIFENESIN ER 1200 MG PO TB12: 1.0000 | ORAL_TABLET | Freq: Two times a day (BID) | ORAL | 0 refills | Status: DC

## 2017-10-06 MED ORDER — IBUPROFEN 800 MG PO TABS
800.0000 mg | ORAL_TABLET | Freq: Once | ORAL | Status: AC
  Administered 2017-10-06: 800 mg via ORAL
  Filled 2017-10-06: qty 1

## 2017-10-06 MED ORDER — ACETAMINOPHEN 500 MG PO TABS
1000.0000 mg | ORAL_TABLET | Freq: Once | ORAL | Status: AC
  Administered 2017-10-06: 1000 mg via ORAL
  Filled 2017-10-06: qty 2

## 2017-10-06 NOTE — ED Notes (Signed)
Patient transported to X-ray 

## 2017-10-06 NOTE — ED Notes (Signed)
ED Provider at bedside. 

## 2017-10-06 NOTE — ED Provider Notes (Signed)
Patient placed in Quick Look pathway, seen and evaluated   Chief Complaint: Flulike symptoms  HPI:   22 year old female who presents with onset of fever, chest tightness, flulike symptoms including myalgias, body aches, cough.  She denies any wheezing.  She has a history of asthma and has been hospitalized but not intubated in the past.  She denies urinary symptoms, abdominal pain, chest pain  ROS: Flulike symptoms (one)  Physical Exam:   Gen: No distress  Neuro: Awake and Alert  Skin: Warm    Focused Exam: Patient is tachycardic, tachypneic, hot to the touch and clearly febrile.  She otherwise appears stable and stood up to go to the bathroom to urinate.   Initiation of care has begun. The patient has been counseled on the process, plan, and necessity for staying for the completion/evaluation, and the remainder of the medical screening examination    Delos HaringHarris, Kana Reimann, PA-C 10/06/17 2002    Lorre NickAllen, Anthony, MD 10/07/17 2214

## 2017-10-06 NOTE — ED Triage Notes (Signed)
Pt arrives from home, complaints of body aches; feels like she has a fever but did not check at home, and shortness of breath. Pt has asthma, pt has taken inhaler three times today with no relief

## 2017-10-06 NOTE — ED Provider Notes (Signed)
[facility] EMERGENCY DEPARTMENT Provider Note   CSN: [id number] Arrival date & time: 10/06/17  1937     History   Chief Complaint Chief Complaint  Patient presents with  . Fever  . Shortness of Breath    HPI Deborah Wells is a 22 y.o. female.  HPI Patient presents to the emergency department with fever, body aches, cough, sore throat started this morning.  The patient states the fever got worse as the day went on.  She states that seemed to make the condition better.  She states she did not take any medications at home.  Patient states she does not know she is been around anyone with similar symptoms.  The patient denies chest pain, shortness of breath, headache,blurred vision, neck pain,  weakness, numbness, dizziness, anorexia, edema, abdominal pain, nausea, vomiting, diarrhea, rash, back pain, dysuria, hematemesis, bloody stool, near syncope, or syncope. Past Medical History:  Diagnosis Date  . Hypertension   . Medical history non-contributory     Patient Active Problem List   Diagnosis Date Noted  . S/P cesarean section 06/21/2016  . Gestational hypertension 06/19/2016  . S/P emergency cesarean section for fetal bradycardia 01/21/2016    Past Surgical History:  Procedure Laterality Date  . CESAREAN SECTION N/A 06/21/2016   Procedure: CESAREAN SECTION;  Surgeon: Tereso Newcomer, MD;  Location: [facility];  Service: Obstetrics;  Laterality: N/A;  . INSERTION OF NON VAGINAL CONTRACEPTIVE DEVICE  06/24/2016      . NO PAST SURGERIES       OB History    Gravida  1   Para  1   Term  1   Preterm      AB      Living  1     SAB      TAB      Ectopic      Multiple  0   Live Births  1            Home Medications    Prior to Admission medications   Medication Sig Start Date End Date Taking? Authorizing Provider  etonogestrel (NEXPLANON) 68 MG IMPL implant 1 each by Subdermal route once. EVERY 5 YEARS 06/24/17 06/24/22  Yes [provider]  amLODipine (NORVASC) 5 MG tablet Take 1 tablet (5 mg total) by mouth daily. Patient not taking: Reported on 10/06/2017 06/25/16   Mumaw, Hiram Comber, DO  ibuprofen (ADVIL,MOTRIN) 600 MG tablet Take 1 tablet (600 mg total) by mouth every 6 (six) hours as needed. Patient not taking: Reported on 10/06/2017 03/23/17   Audry Pili, PA-C  naproxen (NAPROSYN) 500 MG tablet Take 1 tablet (500 mg total) by mouth 2 (two) times daily. Patient not taking: Reported on 10/06/2017 01/31/17   Jaynie Crumble, PA-C    Family History Family History  Problem Relation Age of Onset  . Diabetes Mother   . Cancer Maternal Grandmother     Social History Social History   Tobacco Use  . Smoking status: Never Smoker  . Smokeless tobacco: Never Used  Substance Use Topics  . Alcohol use: No  . Drug use: No     Allergies   Patient has no known allergies.   Review of Systems Review of Systems All other systems negative except as documented in the HPI. All pertinent positives and negatives as reviewed in the HPI.  Physical Exam Updated Vital Signs BP (!) 107/53   Pulse (!) 103   Temp (!) 97.4 F (36.3  C)   Resp (!) 26   Ht 5\' 2"  (1.575 m)   Wt 67.1 kg (148 lb)   SpO2 98%   BMI 27.07 kg/m   Physical Exam  Constitutional: She is oriented to person, place, and time. She appears well-developed and well-nourished. No distress.  HENT:  Head: Normocephalic and atraumatic.  Mouth/Throat: Oropharynx is clear and moist.  Eyes: Pupils are equal, round, and reactive to light.  Neck: Normal range of motion. Neck supple.  Cardiovascular: Normal rate, regular rhythm and normal heart sounds. Exam reveals no gallop and no friction rub.  No murmur heard. Pulmonary/Chest: Effort normal and breath sounds normal. No accessory muscle usage. No tachypnea and no bradypnea. No respiratory distress. She has no wheezes.  Abdominal: Soft. Bowel sounds are normal. She exhibits no  distension. There is no tenderness.  Neurological: She is alert and oriented to person, place, and time. She exhibits normal muscle tone. Coordination normal.  Skin: Skin is warm and dry. Capillary refill takes less than 2 seconds. No rash noted. No erythema.  Psychiatric: She has a normal mood and affect. Her behavior is normal.  Nursing note and vitals reviewed.    ED Treatments / Results  Labs (all labs ordered are listed, but only abnormal results are displayed) Labs Reviewed  URINALYSIS, ROUTINE W REFLEX MICROSCOPIC - Abnormal; Notable for the following components:      Result Value   APPearance HAZY (*)    All other components within normal limits  INFLUENZA PANEL BY PCR (TYPE A & B)  I-STAT BETA HCG BLOOD, ED ([facility], [facility], [facility] ONLY)    EKG None  Radiology Dg Chest 2 View  Result Date: 10/06/2017 CLINICAL DATA:  Tachycardia, tachypnea, fever. EXAM: CHEST - 2 VIEW COMPARISON:  None. FINDINGS: The cardiomediastinal contours are normal. The lungs are clear. Pulmonary vasculature is normal. No consolidation, pleural effusion, or pneumothorax. Mild broad-based rightward curvature of the thoracic spine. No acute osseous abnormalities are seen. IMPRESSION: No acute pulmonary process. Electronically Signed   By: Rubye OaksMelanie  Ehinger M.D.   On: 10/06/2017 21:06    Procedures Procedures (including critical care time)  Medications Ordered in ED Medications  ibuprofen (ADVIL,MOTRIN) tablet 800 mg (800 mg Oral Given 10/06/17 2004)  sodium chloride 0.9 % bolus 1,000 mL (0 mLs Intravenous Stopped 10/06/17 2149)  sodium chloride 0.9 % bolus 1,000 mL (0 mLs Intravenous Stopped 10/06/17 2338)  acetaminophen (TYLENOL) tablet 1,000 mg (1,000 mg Oral Given 10/06/17 2153)     Initial Impression / Assessment and Plan / ED Course  I have reviewed the triage vital signs and the nursing notes.  Pertinent labs & imaging results that were available during my care of the patient were reviewed by me and  considered in my medical decision making (see chart for details).    Patient will be treated for influenza.  Have advised her to increase her fluid intake and rest as much as possible.  Told to return for any worsening in her condition patient agrees with plan and all questions were answered.  Patient did receive 2 L of fluid here in the emergency department and her temperature is reduced down to 97.5.  Patient does report some improvement in her symptoms.  Final Clinical Impressions(s) / ED Diagnoses   Final diagnoses:  None    ED Discharge Orders    None       Charlestine NightLawyer, Myrtha Tonkovich, PA-C 10/06/17 2354    Charlynne PanderYao, David Hsienta, MD 10/10/17 1044

## 2017-10-06 NOTE — Discharge Instructions (Addendum)
Increase your fluid intake and rest as much as possible.  Also take 1000 mg of Tylenol every 4 hours for fever.  Return here as needed.  Flu can last up to 10-14 days.

## 2017-10-06 NOTE — ED Notes (Signed)
The pt reports that she feels better after the tylenol  No pain

## 2017-10-06 NOTE — ED Notes (Signed)
Patient ambulatory to bathroom with steady gait at this time 

## 2018-01-11 ENCOUNTER — Emergency Department (HOSPITAL_COMMUNITY)
Admission: EM | Admit: 2018-01-11 | Discharge: 2018-01-11 | Disposition: A | Discharge status: Home / Self Care | Payer: Medicaid Other | Attending: Emergency Medicine | Admitting: Emergency Medicine

## 2018-01-11 ENCOUNTER — Other Ambulatory Visit: Payer: Self-pay

## 2018-01-11 ENCOUNTER — Encounter (HOSPITAL_COMMUNITY): Payer: Self-pay

## 2018-01-11 DIAGNOSIS — I10 Essential (primary) hypertension: Secondary | ICD-10-CM | POA: Insufficient documentation

## 2018-01-11 DIAGNOSIS — R51 Headache: Secondary | ICD-10-CM | POA: Insufficient documentation

## 2018-01-11 DIAGNOSIS — N899 Noninflammatory disorder of vagina, unspecified: Secondary | ICD-10-CM | POA: Diagnosis present

## 2018-01-11 DIAGNOSIS — N898 Other specified noninflammatory disorders of vagina: Secondary | ICD-10-CM

## 2018-01-11 DIAGNOSIS — R519 Headache, unspecified: Secondary | ICD-10-CM

## 2018-01-11 LAB — POC URINE PREG, ED: Preg Test, Ur: NEGATIVE

## 2018-01-11 MED ORDER — DIPHENHYDRAMINE HCL 25 MG PO CAPS
25.0000 mg | ORAL_CAPSULE | Freq: Once | ORAL | Status: AC
  Administered 2018-01-11: 25 mg via ORAL
  Filled 2018-01-11: qty 1

## 2018-01-11 MED ORDER — KETOROLAC TROMETHAMINE 30 MG/ML IJ SOLN
30.0000 mg | Freq: Once | INTRAMUSCULAR | Status: DC
  Filled 2018-01-11: qty 1

## 2018-01-11 MED ORDER — METOCLOPRAMIDE HCL 10 MG PO TABS
5.0000 mg | ORAL_TABLET | Freq: Once | ORAL | Status: AC
  Administered 2018-01-11: 5 mg via ORAL
  Filled 2018-01-11: qty 1

## 2018-01-11 NOTE — ED Notes (Signed)
Pt reports she is concerned that she is pregnant and would like to be tested for STD's

## 2018-01-11 NOTE — ED Provider Notes (Signed)
[facility]   CSN: [id number] Arrival date & time: 01/11/18  1130     History   Chief Complaint Chief Complaint  Patient presents with  . Headache    HPI Deborah Wells is a 22 y.o. female.  HPI   22 year old female presents today with complaints of headache.  Patient notes a slow onset frontal headache sharp in nature over the last 4 days.  She denies any associated nausea or vomiting, denies any fever.  She notes that this feels similar to previous episodes of headache when she was pregnant.  She denies any distal neurological deficits, trauma.  Patient also reports she is having vaginal discharge but does not want this evaluated today.  She denies any associated abdominal pain.  She is sexually active with female partner.  Patient notes attempting using Tylenol Advil and tramadol at home without significant improvement in symptoms, she did not take any medication today.  Past Medical History:  Diagnosis Date  . Hypertension   . Medical history non-contributory     Patient Active Problem List   Diagnosis Date Noted  . S/P cesarean section 06/21/2016  . Gestational hypertension 06/19/2016  . S/P emergency cesarean section for fetal bradycardia 01/21/2016    Past Surgical History:  Procedure Laterality Date  . CESAREAN SECTION N/A 06/21/2016   Procedure: CESAREAN SECTION;  Surgeon: Tereso NewcomerUgonna A Anyanwu, MD;  Location: [facility];  Service: Obstetrics;  Laterality: N/A;  . INSERTION OF NON VAGINAL CONTRACEPTIVE DEVICE  06/24/2016      . NO PAST SURGERIES       OB History    Gravida  1   Para  1   Term  1   Preterm      AB      Living  1     SAB      TAB      Ectopic      Multiple  0   Live Births  1            Home Medications    Prior to Admission medications   Medication Sig Start Date End Date Taking? Authorizing Provider  acetaminophen-codeine 120-12 MG/5ML solution Take 10 mLs  by mouth every 4 (four) hours as needed for moderate pain. 10/06/17   Lawyer, Cristal Deerhristopher, PA-C  amLODipine (NORVASC) 5 MG tablet Take 1 tablet (5 mg total) by mouth daily. Patient not taking: Reported on 10/06/2017 06/25/16   Michaele OfferMumaw, Elizabeth Woodland, DO  etonogestrel (NEXPLANON) 68 MG IMPL implant 1 each by Subdermal route once. EVERY 5 YEARS 06/24/17 06/24/22  [provider]  Guaifenesin 1200 MG TB12 Take 1 tablet (1,200 mg total) by mouth 2 (two) times daily. 10/06/17   Lawyer, Cristal Deerhristopher, PA-C  ibuprofen (ADVIL,MOTRIN) 800 MG tablet Take 1 tablet (800 mg total) by mouth every 8 (eight) hours as needed. 10/06/17   Lawyer, Cristal Deerhristopher, PA-C  naproxen (NAPROSYN) 500 MG tablet Take 1 tablet (500 mg total) by mouth 2 (two) times daily. Patient not taking: Reported on 10/06/2017 01/31/17   Jaynie CrumbleKirichenko, Tatyana, PA-C    Family History Family History  Problem Relation Age of Onset  . Diabetes Mother   . Cancer Maternal Grandmother     Social History Social History   Tobacco Use  . Smoking status: Never Smoker  . Smokeless tobacco: Never Used  Substance Use Topics  . Alcohol use: No  . Drug use: No     Allergies   Patient  has no known allergies.   Review of Systems Review of Systems  All other systems reviewed and are negative.   Physical Exam Updated Vital Signs BP 112/74 (BP Location: Right Arm)   Pulse 84   Temp 98.5 F (36.9 C) (Oral)   Resp 20   SpO2 100%   Physical Exam  Constitutional: She is oriented to person, place, and time. She appears well-developed and well-nourished.  HENT:  Head: Normocephalic and atraumatic.  Eyes: Pupils are equal, round, and reactive to light. Conjunctivae are normal. Right eye exhibits no discharge. Left eye exhibits no discharge. No scleral icterus.  Neck: Normal range of motion. No JVD present. No tracheal deviation present.  Pulmonary/Chest: Effort normal. No stridor.  Abdominal: Soft. She exhibits no distension and no mass.  There is no tenderness. There is no rebound and no guarding. No hernia.  Neurological: She is alert and oriented to person, place, and time. She has normal strength. Coordination and gait normal. GCS eye subscore is 4. GCS verbal subscore is 5. GCS motor subscore is 6.  Psychiatric: She has a normal mood and affect. Her behavior is normal. Judgment and thought content normal.  Nursing note and vitals reviewed.    ED Treatments / Results  Labs (all labs ordered are listed, but only abnormal results are displayed) Labs Reviewed  POC URINE PREG, ED    EKG None  Radiology No results found.  Procedures Procedures (including critical care time)  Medications Ordered in ED Medications  ketorolac (TORADOL) 30 MG/ML injection 30 mg (30 mg Intramuscular Refused 01/11/18 1328)  metoCLOPramide (REGLAN) tablet 5 mg (5 mg Oral Given 01/11/18 1326)  diphenhydrAMINE (BENADRYL) capsule 25 mg (25 mg Oral Given 01/11/18 1326)     Initial Impression / Assessment and Plan / ED Course  I have reviewed the triage vital signs and the nursing notes.  Pertinent labs & imaging results that were available during my care of the patient were reviewed by me and considered in my medical decision making (see chart for details).     Labs:   Imaging:  Consults:  Therapeutics: Toradol, Reglan, Benadryl  Discharge Meds:   Assessment/Plan: 22 year old female presents today with headache.  This is uncomplicated with no red flags no need for imaging, symptoms improved with Toradol Reglan and Benadryl.  Patient also having vaginal discharge but is refusing exam here today reporting she will follow-up as an outpatient for evaluation of this.  Patient having no systemic complaints or abdominal pain, discharged with outpatient follow-up information, strict return precautions.  She verbalized understanding and agreement to today's plan had no further questions or concerns.  Final Clinical Impressions(s) / ED  Diagnoses   Final diagnoses:  Acute nonintractable headache, unspecified headache type  Vaginal discharge    ED Discharge Orders    None       Eyvonne Mechanic, PA-C 01/11/18 1717    Pricilla Loveless, MD 01/11/18 1724

## 2018-01-11 NOTE — ED Triage Notes (Signed)
[facility]- pt coming from home with complaint of HA X5 days. No n/v/d. No weakness, dizziness or blurred vision. She has taken ibuprofen, asa, and tramadol with no relief. Pt AOX4, ambulatory.

## 2018-01-11 NOTE — Discharge Instructions (Addendum)
Please read attached information. If you experience any new or worsening signs or symptoms please return to the emergency room for evaluation. Please follow-up with your primary care provider or specialist as discussed.  °

## 2018-03-19 ENCOUNTER — Emergency Department (HOSPITAL_COMMUNITY)
Admission: EM | Admit: 2018-03-19 | Discharge: 2018-03-19 | Disposition: A | Discharge status: Home / Self Care | Payer: Medicaid Other | Attending: Emergency Medicine | Admitting: Emergency Medicine

## 2018-03-19 ENCOUNTER — Encounter (HOSPITAL_COMMUNITY): Payer: Self-pay | Admitting: Emergency Medicine

## 2018-03-19 DIAGNOSIS — I10 Essential (primary) hypertension: Secondary | ICD-10-CM | POA: Diagnosis not present

## 2018-03-19 DIAGNOSIS — B009 Herpesviral infection, unspecified: Secondary | ICD-10-CM | POA: Diagnosis not present

## 2018-03-19 DIAGNOSIS — Z79899 Other long term (current) drug therapy: Secondary | ICD-10-CM | POA: Diagnosis not present

## 2018-03-19 DIAGNOSIS — R220 Localized swelling, mass and lump, head: Secondary | ICD-10-CM | POA: Diagnosis present

## 2018-03-19 DIAGNOSIS — B002 Herpesviral gingivostomatitis and pharyngotonsillitis: Secondary | ICD-10-CM

## 2018-03-19 MED ORDER — ACYCLOVIR 5 % EX OINT: 1.0000 "application " | TOPICAL_OINTMENT | CUTANEOUS | 0 refills | Status: DC | PRN

## 2018-03-19 MED ORDER — VALACYCLOVIR HCL 1 G PO TABS: 2000.0000 mg | ORAL_TABLET | Freq: Two times a day (BID) | ORAL | 0 refills | Status: AC

## 2018-03-19 NOTE — Discharge Instructions (Signed)
Follow up with your doctor.  Return here as needed 

## 2018-03-19 NOTE — ED Provider Notes (Signed)
[facility] EMERGENCY DEPARTMENT Provider Note   CSN: [id number] Arrival date & time: 03/19/18  1751     History   Chief Complaint Chief Complaint  Patient presents with  . Oral Swelling    HPI Deborah Wells is a 22 y.o. female who presents to the ED with swelling and a blister to the top lip. The blister came up this morning. Patient c/o facial swelling around the upper lip. Patient reports having cold sores but just not usually this bad.   HPI  Past Medical History:  Diagnosis Date  . Hypertension   . Medical history non-contributory     Patient Active Problem List   Diagnosis Date Noted  . S/P cesarean section 06/21/2016  . Gestational hypertension 06/19/2016  . S/P emergency cesarean section for fetal bradycardia 01/21/2016    Past Surgical History:  Procedure Laterality Date  . CESAREAN SECTION N/A 06/21/2016   Procedure: CESAREAN SECTION;  Surgeon: Tereso Newcomer, MD;  Location: [facility];  Service: Obstetrics;  Laterality: N/A;  . INSERTION OF NON VAGINAL CONTRACEPTIVE DEVICE  06/24/2016      . NO PAST SURGERIES       OB History    Gravida  1   Para  1   Term  1   Preterm      AB      Living  1     SAB      TAB      Ectopic      Multiple  0   Live Births  1            Home Medications    Prior to Admission medications   Medication Sig Start Date End Date Taking? Authorizing Provider  acetaminophen-codeine 120-12 MG/5ML solution Take 10 mLs by mouth every 4 (four) hours as needed for moderate pain. 10/06/17   Lawyer, Cristal Deer, PA-C  acyclovir ointment (ZOVIRAX) 5 % Apply 1 application topically every 4 (four) hours as needed. 03/19/18   Janne Napoleon, NP  amLODipine (NORVASC) 5 MG tablet Take 1 tablet (5 mg total) by mouth daily. Patient not taking: Reported on 10/06/2017 06/25/16   Michaele Offer, DO  etonogestrel (NEXPLANON) 68 MG IMPL implant 1 each by Subdermal route once. EVERY 5 YEARS 06/24/17 06/24/22   [provider]  Guaifenesin 1200 MG TB12 Take 1 tablet (1,200 mg total) by mouth 2 (two) times daily. 10/06/17   Lawyer, Cristal Deer, PA-C  ibuprofen (ADVIL,MOTRIN) 800 MG tablet Take 1 tablet (800 mg total) by mouth every 8 (eight) hours as needed. 10/06/17   Lawyer, Cristal Deer, PA-C  valACYclovir (VALTREX) 1000 MG tablet Take 2 tablets (2,000 mg total) by mouth 2 (two) times daily for 1 day. 03/19/18 03/20/18  Janne Napoleon, NP    Family History Family History  Problem Relation Age of Onset  . Diabetes Mother   . Cancer Maternal Grandmother     Social History Social History   Tobacco Use  . Smoking status: Never Smoker  . Smokeless tobacco: Never Used  Substance Use Topics  . Alcohol use: No  . Drug use: No     Allergies   Patient has no known allergies.   Review of Systems Review of Systems  Skin: Positive for rash.       Swelling of upper lip of face  All other systems reviewed and are negative.    Physical Exam Updated Vital Signs BP 132/78 (BP Location: Right Arm)   Pulse 78  Temp 99 F (37.2 C) (Oral)   Resp 16   Wt 67.1 kg   SpO2 100%   BMI 27.06 kg/m   Physical Exam  Constitutional: She is oriented to person, place, and time. She appears well-developed and well-nourished. No distress.  HENT:  Mouth/Throat: Uvula is midline and oropharynx is clear and moist.    Swelling and vesicular lesions to the upper lip.  Eyes: Conjunctivae and EOM are normal.  Neck: Neck supple.  Cardiovascular: Normal rate.  Pulmonary/Chest: Effort normal.  Abdominal: Soft. There is no tenderness.  Musculoskeletal: Normal range of motion.  Neurological: She is alert and oriented to person, place, and time. No cranial nerve deficit.  Skin: Skin is warm and dry.  Psychiatric: She has a normal mood and affect.  Nursing note and vitals reviewed.    ED Treatments / Results  Labs (all labs ordered are listed, but only abnormal results are displayed) Labs Reviewed  - No data to display  Radiology No results found.  Procedures Procedures (including critical care time)  Medications Ordered in ED Medications - No data to display   Initial Impression / Assessment and Plan / ED Course  I have reviewed the triage vital signs and the nursing notes. 22 y.o. 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 Will treat with Valtrex. Discussed plan of care with patient and advised against oral sex due to the lesions. Patient agrees with plan.   Final Clinical Impressions(s) / ED Diagnoses   Final diagnoses:  Oral herpes simplex infection    ED Discharge Orders         Ordered    valACYclovir (VALTREX) 1000 MG tablet  2 times daily     03/19/18 1847    acyclovir ointment (ZOVIRAX) 5 %  Every 4 hours PRN     03/19/18 1847           Damian Leavell [place], NP 03/19/18 1851    Mancel Bale, MD 03/21/18 [phone]

## 2018-03-19 NOTE — ED Triage Notes (Signed)
Pt has swelling and blister to top lip.  Started this morning.

## 2018-05-30 ENCOUNTER — Encounter (HOSPITAL_COMMUNITY): Payer: Self-pay | Admitting: Emergency Medicine

## 2018-05-30 ENCOUNTER — Other Ambulatory Visit: Payer: Self-pay

## 2018-05-30 ENCOUNTER — Emergency Department (HOSPITAL_COMMUNITY)
Admission: EM | Admit: 2018-05-30 | Discharge: 2018-05-30 | Disposition: A | Discharge status: Home / Self Care | Payer: Medicaid Other | Attending: Emergency Medicine | Admitting: Emergency Medicine

## 2018-05-30 DIAGNOSIS — J029 Acute pharyngitis, unspecified: Secondary | ICD-10-CM | POA: Insufficient documentation

## 2018-05-30 LAB — GROUP A STREP BY PCR: GROUP A STREP BY PCR: NOT DETECTED

## 2018-05-30 MED ORDER — ACETAMINOPHEN 325 MG PO TABS
650.0000 mg | ORAL_TABLET | Freq: Once | ORAL | Status: AC
  Administered 2018-05-30: 650 mg via ORAL
  Filled 2018-05-30: qty 2

## 2018-05-30 NOTE — Discharge Instructions (Signed)
Tylenol every 4 hours.  Recheck in 2 days if not improved.

## 2018-05-30 NOTE — ED Triage Notes (Signed)
Pt reports sore throat and body aches since yesterday. States thought she had a fever yesterday but didn't take temp. Reports not feeling febrile today.

## 2018-05-30 NOTE — ED Provider Notes (Signed)
[facility] EMERGENCY DEPARTMENT Provider Note   CSN: [id number] Arrival date & time: 05/30/18  4098     History   Chief Complaint Chief Complaint  Patient presents with  . Sore Throat    HPI Deborah Wells is a 22 y.o. female.  The history is provided by the patient. No language interpreter was used.  Sore Throat  This is a new problem. The current episode started yesterday. The problem occurs constantly. The problem has not changed since onset.Nothing aggravates the symptoms. Nothing relieves the symptoms. She has tried nothing for the symptoms. The treatment provided no relief.  Pt complains of a sore throat.  Pt is worried that she could have strep.  Pt works in a group home  Past Medical History:  Diagnosis Date  . Hypertension    pregnancy hypertension  . Medical history non-contributory     Patient Active Problem List   Diagnosis Date Noted  . S/P cesarean section 06/21/2016  . Gestational hypertension 06/19/2016  . S/P emergency cesarean section for fetal bradycardia 01/21/2016    Past Surgical History:  Procedure Laterality Date  . CESAREAN SECTION N/A 06/21/2016   Procedure: CESAREAN SECTION;  Surgeon: Tereso Newcomer, MD;  Location: [facility];  Service: Obstetrics;  Laterality: N/A;  . INSERTION OF NON VAGINAL CONTRACEPTIVE DEVICE  06/24/2016      . NO PAST SURGERIES       OB History    Gravida  1   Para  1   Term  1   Preterm      AB      Living  1     SAB      TAB      Ectopic      Multiple  0   Live Births  1            Home Medications    Prior to Admission medications   Medication Sig Start Date End Date Taking? Authorizing Provider  acetaminophen-codeine 120-12 MG/5ML solution Take 10 mLs by mouth every 4 (four) hours as needed for moderate pain. 10/06/17   Lawyer, Cristal Deer, PA-C  acyclovir ointment (ZOVIRAX) 5 % Apply 1 application topically every 4 (four) hours as needed. 03/19/18   Janne Napoleon, NP    amLODipine (NORVASC) 5 MG tablet Take 1 tablet (5 mg total) by mouth daily. Patient not taking: Reported on 10/06/2017 06/25/16   Michaele Offer, DO  etonogestrel (NEXPLANON) 68 MG IMPL implant 1 each by Subdermal route once. EVERY 5 YEARS 06/24/17 06/24/22  [provider]  Guaifenesin 1200 MG TB12 Take 1 tablet (1,200 mg total) by mouth 2 (two) times daily. 10/06/17   Lawyer, Cristal Deer, PA-C  ibuprofen (ADVIL,MOTRIN) 800 MG tablet Take 1 tablet (800 mg total) by mouth every 8 (eight) hours as needed. 10/06/17   Charlestine Night, PA-C    Family History Family History  Problem Relation Age of Onset  . Diabetes Mother   . Cancer Maternal Grandmother     Social History Social History   Tobacco Use  . Smoking status: Never Smoker  . Smokeless tobacco: Never Used  Substance Use Topics  . Alcohol use: No  . Drug use: No     Allergies   Patient has no known allergies.   Review of Systems Review of Systems  All other systems reviewed and are negative.    Physical Exam Updated Vital Signs BP 132/80 (BP Location: Right Arm)   Pulse (!) 113  Temp 98.7 F (37.1 C) (Oral)   Resp 16   Ht 5\' 2"  (1.575 m)   Wt 63.5 kg   LMP 05/30/2018   SpO2 99%   BMI 25.61 kg/m   Physical Exam  Constitutional: She is oriented to person, place, and time. She appears well-developed and well-nourished.  HENT:  Head: Normocephalic.  Mouth/Throat: Posterior oropharyngeal edema and posterior oropharyngeal erythema present.  Eyes: EOM are normal.  Neck: Normal range of motion.  Pulmonary/Chest: Effort normal.  Abdominal: She exhibits no distension.  Musculoskeletal: Normal range of motion.  Neurological: She is alert and oriented to person, place, and time.  Psychiatric: She has a normal mood and affect.  Nursing note and vitals reviewed.    ED Treatments / Results  Labs (all labs ordered are listed, but only abnormal results are displayed) Labs Reviewed  GROUP A  STREP BY PCR    EKG None  Radiology No results found.  Procedures Procedures (including critical care time)  Medications Ordered in ED Medications  acetaminophen (TYLENOL) tablet 650 mg (650 mg Oral Given 05/30/18 0900)     Initial Impression / Assessment and Plan / ED Course  I have reviewed the triage vital signs and the nursing notes.  Pertinent labs & imaging results that were available during my care of the patient were reviewed by me and considered in my medical decision making (see chart for details).     Strep negative.   Pt advised probable viral infection.  Pt advised tylenol lozenges,  2 day recheck if not improving  Final Clinical Impressions(s) / ED Diagnoses   Final diagnoses:  Pharyngitis, unspecified etiology    ED Discharge Orders    None    An After Visit Summary was printed and given to the patient.    Elson AreasSofia, Atzel Mccambridge K, PA-C 05/30/18 [phone]    Samuel JesterMcManus, Kathleen, DO 05/30/18 1559

## 2018-12-31 ENCOUNTER — Telehealth: Payer: Self-pay | Admitting: *Deleted

## 2018-12-31 NOTE — Telephone Encounter (Signed)
Call "rejected" and pt has no MyChart. [place]

## 2019-01-01 ENCOUNTER — Encounter: Payer: Self-pay | Admitting: Adult Health

## 2019-01-01 ENCOUNTER — Ambulatory Visit (INDEPENDENT_AMBULATORY_CARE_PROVIDER_SITE_OTHER): Payer: Medicaid Other | Admitting: Adult Health

## 2019-01-01 ENCOUNTER — Other Ambulatory Visit: Payer: Self-pay

## 2019-01-01 VITALS — BP 133/84 | HR 89 | Ht 62.0 in | Wt 150.0 lb

## 2019-01-01 DIAGNOSIS — Z3202 Encounter for pregnancy test, result negative: Secondary | ICD-10-CM | POA: Diagnosis not present

## 2019-01-01 DIAGNOSIS — Z30011 Encounter for initial prescription of contraceptive pills: Secondary | ICD-10-CM | POA: Insufficient documentation

## 2019-01-01 DIAGNOSIS — Z975 Presence of (intrauterine) contraceptive device: Secondary | ICD-10-CM

## 2019-01-01 DIAGNOSIS — R51 Headache: Secondary | ICD-10-CM | POA: Diagnosis not present

## 2019-01-01 DIAGNOSIS — R519 Headache, unspecified: Secondary | ICD-10-CM | POA: Insufficient documentation

## 2019-01-01 LAB — POCT URINE PREGNANCY: Preg Test, Ur: NEGATIVE

## 2019-01-01 MED ORDER — LO LOESTRIN FE 1 MG-10 MCG / 10 MCG PO TABS: 1.0000 | ORAL_TABLET | Freq: Every day | ORAL | 11 refills | Status: DC

## 2019-01-01 NOTE — Progress Notes (Signed)
Patient ID: Deborah Wells, female   DOB: 1996/03/20, 23 y.o.   MRN: [id number] History of Present Illness: Deborah Wells is a 23 year old black female, single, G1P1, in wanting to discuss changing birth control, she has nexplanon for about 2.5 years and is having headaches 2-3 x weekly, she had pre-eclampsia when pregnant. 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  No PCP.    Current Medications, Allergies, Past Medical History, Past Surgical History, Family History and Social History were reviewed in [employer] record.     Review of Systems: Has headaches 2-3 x weekly, denies any vision loss or numbness and she usually takes tylenol and it does help sometimes.    Physical Exam:BP 133/84 (BP Location: Right Arm, Patient Position: Sitting, Cuff Size: Normal)   Pulse 89   Ht 5\' 2"  (1.575 m)   Wt 150 lb (68 kg)   BMI 27.44 kg/m UPT is negative.  General:  Well developed, well nourished, no acute distress Skin:  Warm and dry Neck:  Midline trachea, normal thyroid, good ROM, no lymphadenopathy Lungs; Clear to auscultation bilaterally Cardiovascular: Regular rate and rhythm Psych:  No mood changes, alert and cooperative,seems happy Fall risk is low. PHQ 2 score 0.  Discussed will rx lo loestrin to start Sunday and return on 6/2 to remove nexplanon.  Impression: 1. Encounter for initial prescription of contraceptive pills   2. Pregnancy examination or test, negative result   3. Nexplanon in place   4. Frequent headaches       Plan: Start lo loestrin Sunday Return 6/22 at 4 pm for nexplanon removal Will need to use condoms for 1 pack Meds ordered this encounter  Medications  . Norethindrone-Ethinyl Estradiol-Fe Biphas (LO LOESTRIN FE) 1 MG-10 MCG / 10 MCG tablet    Sig: Take 1 tablet by mouth daily. Take 1 daily by mouth    Dispense:  1 Package    Refill:  11    BIN [id number], PCN CN, GRP [id number]    Order Specific Question:   Supervising Provider     Answer:   Florian Buff [[id number]]  Will get scheduled for pap and physical at next appt.

## 2019-01-03 ENCOUNTER — Telehealth: Payer: Self-pay | Admitting: Adult Health

## 2019-01-03 NOTE — Telephone Encounter (Signed)

## 2019-01-07 ENCOUNTER — Ambulatory Visit (INDEPENDENT_AMBULATORY_CARE_PROVIDER_SITE_OTHER): Payer: Medicaid Other | Admitting: Adult Health

## 2019-01-07 ENCOUNTER — Other Ambulatory Visit: Payer: Self-pay

## 2019-01-07 ENCOUNTER — Encounter: Payer: Self-pay | Admitting: Adult Health

## 2019-01-07 VITALS — BP 140/86 | HR 89 | Ht 62.0 in | Wt 149.0 lb

## 2019-01-07 DIAGNOSIS — Z3046 Encounter for surveillance of implantable subdermal contraceptive: Secondary | ICD-10-CM | POA: Diagnosis not present

## 2019-01-07 NOTE — Patient Instructions (Addendum)
Use condoms x 4 weeks, keep clean and dry x 24 hours, no heavy lifting, keep steri strips on x 72 hours, Keep pressure dressing on x 24 hours. Follow up prn problems.  

## 2019-01-07 NOTE — Progress Notes (Signed)
Patient ID: Deborah Wells, female   DOB: 08-25-1995, 23 y.o.   MRN: [id number] History of Present Illness: Deborah Wells is a 23 year old black female in for nexplanon removal, and is on OCs already.   Current Medications, Allergies, Past Medical History, Past Surgical History, Family History and Social History were reviewed in [employer] record.     Review of Systems: For nexplanon removal     Physical Exam:BP 140/86 (BP Location: Right Arm, Patient Position: Sitting, Cuff Size: Normal)   Pulse 89   Ht 5\' 2"  (1.575 m)   Wt 149 lb (67.6 kg)   LMP 10/28/2018 (Approximate)   BMI 27.25 kg/m  General:  Well developed, well nourished, no acute distress Skin:  Warm and dry Psych:  No mood changes, alert and cooperative,seems happy Consent signed and time out called. Left arm cleansed with betadine, and injected with 2.5 cc 1% lidocaine and waited til numb.Under sterile technique a #11 blade was used to make small vertical incision, and a curved forceps was used to easily remove rod. Steri strips applied. Pressure dressing applied.   Impression: 1. Nexplanon removal       Plan: Use condoms x 4 weeks, keep clean and dry x 24 hours, no heavy lifting, keep steri strips on x 72 hours, Keep pressure dressing on x 24 hours. Follow up prn problems. Return in 4 weeks for first pap and physical

## 2019-02-06 ENCOUNTER — Other Ambulatory Visit: Payer: Medicaid Other | Admitting: Adult Health

## 2019-05-10 ENCOUNTER — Other Ambulatory Visit: Payer: Self-pay

## 2019-05-10 ENCOUNTER — Emergency Department (HOSPITAL_COMMUNITY)
Admission: EM | Admit: 2019-05-10 | Discharge: 2019-05-10 | Disposition: A | Discharge status: Home / Self Care | Payer: Medicaid Other | Attending: Emergency Medicine | Admitting: Emergency Medicine

## 2019-05-10 ENCOUNTER — Encounter (HOSPITAL_COMMUNITY): Payer: Self-pay | Admitting: Emergency Medicine

## 2019-05-10 DIAGNOSIS — N3000 Acute cystitis without hematuria: Secondary | ICD-10-CM | POA: Insufficient documentation

## 2019-05-10 DIAGNOSIS — Z79899 Other long term (current) drug therapy: Secondary | ICD-10-CM | POA: Insufficient documentation

## 2019-05-10 DIAGNOSIS — R102 Pelvic and perineal pain: Secondary | ICD-10-CM | POA: Insufficient documentation

## 2019-05-10 DIAGNOSIS — N898 Other specified noninflammatory disorders of vagina: Secondary | ICD-10-CM | POA: Diagnosis present

## 2019-05-10 DIAGNOSIS — I10 Essential (primary) hypertension: Secondary | ICD-10-CM | POA: Insufficient documentation

## 2019-05-10 DIAGNOSIS — B9689 Other specified bacterial agents as the cause of diseases classified elsewhere: Secondary | ICD-10-CM | POA: Insufficient documentation

## 2019-05-10 DIAGNOSIS — N760 Acute vaginitis: Secondary | ICD-10-CM | POA: Insufficient documentation

## 2019-05-10 LAB — CBC
HCT: 41.7 % (ref 36.0–46.0)
Hemoglobin: 12.6 g/dL (ref 12.0–15.0)
MCH: 26.9 pg (ref 26.0–34.0)
MCHC: 30.2 g/dL (ref 30.0–36.0)
MCV: 88.9 fL (ref 80.0–100.0)
Platelets: 399 10*3/uL (ref 150–400)
RBC: 4.69 MIL/uL (ref 3.87–5.11)
RDW: 13.1 % (ref 11.5–15.5)
WBC: 7.5 10*3/uL (ref 4.0–10.5)
nRBC: 0 % (ref 0.0–0.2)

## 2019-05-10 LAB — URINALYSIS, ROUTINE W REFLEX MICROSCOPIC
Bilirubin Urine: NEGATIVE
Glucose, UA: NEGATIVE mg/dL
Ketones, ur: NEGATIVE mg/dL
Nitrite: NEGATIVE
Protein, ur: NEGATIVE mg/dL
Specific Gravity, Urine: 1.019 (ref 1.005–1.030)
pH: 6 (ref 5.0–8.0)

## 2019-05-10 LAB — WET PREP, GENITAL
Sperm: NONE SEEN
Trich, Wet Prep: NONE SEEN
Yeast Wet Prep HPF POC: NONE SEEN

## 2019-05-10 LAB — COMPREHENSIVE METABOLIC PANEL
ALT: 11 U/L (ref 0–44)
AST: 15 U/L (ref 15–41)
Albumin: 4.5 g/dL (ref 3.5–5.0)
Alkaline Phosphatase: 67 U/L (ref 38–126)
Anion gap: 9 (ref 5–15)
BUN: 13 mg/dL (ref 6–20)
CO2: 25 mmol/L (ref 22–32)
Calcium: 9.5 mg/dL (ref 8.9–10.3)
Chloride: 103 mmol/L (ref 98–111)
Creatinine, Ser: 0.61 mg/dL (ref 0.44–1.00)
GFR calc Af Amer: >60 mL/min (ref >60)
GFR calc non Af Amer: >60 mL/min (ref >60)
Glucose, Bld: 91 mg/dL (ref 70–99)
Potassium: 4 mmol/L (ref 3.5–5.1)
Sodium: 137 mmol/L (ref 135–145)
Total Bilirubin: 0.5 mg/dL (ref 0.3–1.2)
Total Protein: 8.6 g/dL — ABNORMAL HIGH (ref 6.5–8.1)

## 2019-05-10 LAB — PREGNANCY, URINE: Preg Test, Ur: NEGATIVE

## 2019-05-10 LAB — POC URINE PREG, ED: Preg Test, Ur: NEGATIVE

## 2019-05-10 LAB — LIPASE, BLOOD: Lipase: 21 U/L (ref 11–51)

## 2019-05-10 MED ORDER — LIDOCAINE HCL (PF) 1 % IJ SOLN
2.0000 mL | Freq: Once | INTRAMUSCULAR | Status: AC
  Administered 2019-05-10: 15:00:00 2 mL via INTRADERMAL

## 2019-05-10 MED ORDER — AZITHROMYCIN 250 MG PO TABS
1000.0000 mg | ORAL_TABLET | Freq: Once | ORAL | Status: AC
  Administered 2019-05-10: 1000 mg via ORAL
  Filled 2019-05-10: qty 4

## 2019-05-10 MED ORDER — METRONIDAZOLE 0.75 % VA GEL: 1.0000 | Freq: Two times a day (BID) | VAGINAL | 0 refills | Status: AC

## 2019-05-10 MED ORDER — CEFTRIAXONE SODIUM 250 MG IJ SOLR
250.0000 mg | Freq: Once | INTRAMUSCULAR | Status: AC
  Administered 2019-05-10: 250 mg via INTRAMUSCULAR
  Filled 2019-05-10: qty 250

## 2019-05-10 MED ORDER — CEPHALEXIN 500 MG PO CAPS: 500.0000 mg | ORAL_CAPSULE | Freq: Two times a day (BID) | ORAL | 0 refills | Status: AC

## 2019-05-10 MED ORDER — LIDOCAINE HCL (PF) 2 % IJ SOLN: 2.0000 mL | Freq: Once | INTRAMUSCULAR | Status: DC

## 2019-05-10 MED ORDER — LIDOCAINE HCL (PF) 1 % IJ SOLN
INTRAMUSCULAR | Status: AC
  Filled 2019-05-10: qty 2

## 2019-05-10 NOTE — ED Triage Notes (Signed)
Pt states that she thinks that she has a bladder infection she is having pain in her lower abd shes any other s/s.

## 2019-05-10 NOTE — ED Provider Notes (Signed)
[facility] EMERGENCY DEPARTMENT Provider Note   CSN: [id number] Arrival date & time: 05/10/19  1022     History   Chief Complaint Chief Complaint  Patient presents with  . Abdominal Pain    HPI Deborah Wells is a 23 y.o. female.     Patient with history of gonorrhea, high blood pressure presents with vaginal discharge and suprapubic abdominal pain for the past few days.  Patient did have new sexual partner in the past week.     Past Medical History:  Diagnosis Date  . Hypertension    pregnancy hypertension  . Medical history non-contributory   . Vaginal Pap smear, abnormal     Patient Active Problem List   Diagnosis Date Noted  . Encounter for initial prescription of contraceptive pills 01/01/2019  . Frequent headaches 01/01/2019  . Pregnancy examination or test, negative result 01/01/2019  . S/P cesarean section 06/21/2016  . Gestational hypertension 06/19/2016  . S/P emergency cesarean section for fetal bradycardia 01/21/2016    Past Surgical History:  Procedure Laterality Date  . CESAREAN SECTION N/A 06/21/2016   Procedure: CESAREAN SECTION;  Surgeon: Tereso NewcomerUgonna A Anyanwu, MD;  Location: [facility];  Service: Obstetrics;  Laterality: N/A;  . INSERTION OF NON VAGINAL CONTRACEPTIVE DEVICE  06/24/2016      . NO PAST SURGERIES       OB History    Gravida  1   Para  1   Term  1   Preterm      AB      Living  1     SAB      TAB      Ectopic      Multiple  0   Live Births  1            Home Medications    Prior to Admission medications   Medication Sig Start Date End Date Taking? Authorizing Provider  acetaminophen (TYLENOL) 325 MG tablet Take 650 mg by mouth every 6 (six) hours as needed.   Yes [provider]  cephALEXin (KEFLEX) 500 MG capsule Take 1 capsule (500 mg total) by mouth 2 (two) times daily for 5 days. 05/10/19 05/15/19  Blane OharaZavitz, Jalana Moore, MD  metroNIDAZOLE (METROGEL VAGINAL) 0.75 % vaginal gel Place 1  Applicatorful vaginally 2 (two) times daily for 5 days. 05/10/19 05/15/19  Blane OharaZavitz, Tymel Conely, MD  Norethindrone-Ethinyl Estradiol-Fe Biphas (LO LOESTRIN FE) 1 MG-10 MCG / 10 MCG tablet Take 1 tablet by mouth daily. Take 1 daily by mouth Patient not taking: Reported on 05/10/2019 01/01/19   Adline PotterGriffin, Jennifer A, NP    Family History Family History  Problem Relation Age of Onset  . Diabetes Mother   . Cancer Maternal Grandmother     Social History Social History   Tobacco Use  . Smoking status: Never Smoker  . Smokeless tobacco: Never Used  Substance Use Topics  . Alcohol use: No  . Drug use: No     Allergies   Patient has no known allergies.   Review of Systems Review of Systems  Constitutional: Negative for chills and fever.  HENT: Negative for congestion.   Respiratory: Negative for shortness of breath.   Cardiovascular: Negative for chest pain.  Gastrointestinal: Positive for abdominal pain. Negative for vomiting.  Genitourinary: Positive for vaginal discharge. Negative for dysuria and flank pain.  Musculoskeletal: Negative for back pain, neck pain and neck stiffness.  Skin: Negative for rash.  Neurological: Negative for light-headedness and headaches.  Physical Exam Updated Vital Signs BP (!) 107/92 (BP Location: Right Arm)   Pulse (!) 105   Temp 99.1 F (37.3 C) (Oral)   Resp 16   Ht 5\' 2"  (1.575 m)   Wt 68 kg   LMP 04/27/2019   SpO2 100%   BMI 27.44 kg/m   Physical Exam Vitals signs and nursing note reviewed.  Constitutional:      Appearance: She is well-developed.  HENT:     Head: Normocephalic and atraumatic.  Eyes:     General:        Right eye: No discharge.        Left eye: No discharge.     Conjunctiva/sclera: Conjunctivae normal.  Neck:     Musculoskeletal: Normal range of motion and neck supple.     Trachea: No tracheal deviation.  Cardiovascular:     Rate and Rhythm: Normal rate and regular rhythm.  Pulmonary:     Effort: Pulmonary  effort is normal.     Breath sounds: Normal breath sounds.  Abdominal:     General: There is no distension.     Palpations: Abdomen is soft.     Tenderness: There is abdominal tenderness in the suprapubic area. There is no guarding.  Genitourinary:    Comments: Patient has moderate white discharge, no significant tenderness bimanual exam.  No bleeding. Skin:    General: Skin is warm.     Findings: No rash.  Neurological:     Mental Status: She is alert and oriented to person, place, and time.      ED Treatments / Results  Labs (all labs ordered are listed, but only abnormal results are displayed) Labs Reviewed  WET PREP, GENITAL - Abnormal; Notable for the following components:      Result Value   Clue Cells Wet Prep HPF POC PRESENT (*)    WBC, Wet Prep HPF POC MANY (*)    All other components within normal limits  COMPREHENSIVE METABOLIC PANEL - Abnormal; Notable for the following components:   Total Protein 8.6 (*)    All other components within normal limits  URINALYSIS, ROUTINE W REFLEX MICROSCOPIC - Abnormal; Notable for the following components:   APPearance CLOUDY (*)    Hgb urine dipstick SMALL (*)    Leukocytes,Ua MODERATE (*)    Bacteria, UA RARE (*)    All other components within normal limits  LIPASE, BLOOD  CBC  PREGNANCY, URINE  POC URINE PREG, ED  GC/CHLAMYDIA PROBE AMP ([place]) NOT AT [facility]    EKG None  Radiology No results found.  Procedures Procedures (including critical care time)  Medications Ordered in ED Medications  cefTRIAXone (ROCEPHIN) injection 250 mg (has no administration in time range)  azithromycin (ZITHROMAX) tablet 1,000 mg (has no administration in time range)     Initial Impression / Assessment and Plan / ED Course  I have reviewed the triage vital signs and the nursing notes.  Pertinent labs & imaging results that were available during my care of the patient were reviewed by me and considered in my medical decision  making (see chart for details).       Patient presents with clinical concern for cystitis versus cervicitis.  Pelvic exam and culture sent.  Discussed no sexual intercourse and to discuss results with partner once obtained on Monday. Urinalysis pending.  Urine pregnancy negative. Urinalysis consistent with infection and acute cystitis.  Patient to follow-up outpatient for remaining results.  MetroGel given for bacterial vaginitis.  Final Clinical Impressions(s) / ED Diagnoses   Final diagnoses:  Vaginal discharge  Pelvic pain  Acute cystitis without hematuria  Bacterial vaginitis    ED Discharge Orders         Ordered    cephALEXin (KEFLEX) 500 MG capsule  2 times daily     05/10/19 1443    metroNIDAZOLE (METROGEL VAGINAL) 0.75 % vaginal gel  2 times daily     05/10/19 1443           Blane Ohara, MD 05/10/19 1444

## 2019-05-10 NOTE — Discharge Instructions (Signed)
Follow-up with your testing results on Monday. No sexual intercourse for at least 1 week until you see results. Return for fevers, worsening abdominal pain or new concerns.

## 2019-05-14 LAB — GC/CHLAMYDIA PROBE AMP (~~LOC~~) NOT AT ARMC
Chlamydia: POSITIVE — AB
Neisseria Gonorrhea: POSITIVE — AB

## 2019-06-10 ENCOUNTER — Other Ambulatory Visit: Payer: Medicaid Other | Admitting: Adult Health

## 2019-08-16 ENCOUNTER — Ambulatory Visit: Payer: Medicaid Other | Attending: Internal Medicine

## 2019-08-16 ENCOUNTER — Other Ambulatory Visit: Payer: Self-pay

## 2019-08-16 DIAGNOSIS — Z20822 Contact with and (suspected) exposure to covid-19: Secondary | ICD-10-CM

## 2019-08-17 LAB — NOVEL CORONAVIRUS, NAA: SARS-CoV-2, NAA: NOT DETECTED

## 2020-01-22 ENCOUNTER — Ambulatory Visit: Payer: Medicaid Other | Admitting: Adult Health

## 2020-01-22 ENCOUNTER — Encounter: Payer: Self-pay | Admitting: Adult Health

## 2020-01-22 ENCOUNTER — Other Ambulatory Visit (HOSPITAL_COMMUNITY)
Admission: RE | Admit: 2020-01-22 | Discharge: 2020-01-22 | Disposition: A | Discharge status: Home / Self Care | Payer: Medicaid Other | Source: Ambulatory Visit | Attending: Adult Health | Admitting: Adult Health

## 2020-01-22 VITALS — BP 132/87 | HR 89 | Ht 62.0 in | Wt 163.0 lb

## 2020-01-22 DIAGNOSIS — Z3202 Encounter for pregnancy test, result negative: Secondary | ICD-10-CM | POA: Insufficient documentation

## 2020-01-22 DIAGNOSIS — Z30011 Encounter for initial prescription of contraceptive pills: Secondary | ICD-10-CM

## 2020-01-22 DIAGNOSIS — Z01419 Encounter for gynecological examination (general) (routine) without abnormal findings: Secondary | ICD-10-CM | POA: Diagnosis present

## 2020-01-22 DIAGNOSIS — Z0000 Encounter for general adult medical examination without abnormal findings: Secondary | ICD-10-CM

## 2020-01-22 LAB — POCT URINE PREGNANCY: Preg Test, Ur: NEGATIVE

## 2020-01-22 MED ORDER — LO LOESTRIN FE 1 MG-10 MCG / 10 MCG PO TABS: 1.0000 | ORAL_TABLET | Freq: Every day | ORAL | 12 refills | Status: DC

## 2020-01-22 NOTE — Progress Notes (Signed)
Patient ID: Deborah Wells, female   DOB: [birth year], 24 y.o.   MRN: [id number] History of Present Illness: [name] is a 24 year old black female,single, G1P1 in for well woman gyn exam and pap. Is not using OCs, but wants to resume.   Current Medications, Allergies, Past Medical History, Past Surgical History, Family History and Social History were reviewed in [employer] record.     Review of Systems: Patient denies any daily headaches, hearing loss, fatigue, blurred vision, shortness of breath, chest pain, abdominal pain, problems with bowel movements, urination, or intercourse. No joint pain or mood swings.    Physical Exam:BP 132/87 (BP Location: Right Arm, Patient Position: Sitting, Cuff Size: Normal)   Pulse 89   Ht 5\' 2"  (1.575 m)   Wt 163 lb (73.9 kg)   LMP 12/21/2019 (Exact Date)   BMI 29.81 kg/m UPT is negative General:  Well developed, well nourished, no acute distress Skin:  Warm and dry Neck:  Midline trachea, normal thyroid, good ROM, no lymphadenopathy Lungs; Clear to auscultation bilaterally Breast:  No dominant palpable mass, retraction, or nipple discharge Cardiovascular: Regular rate and rhythm Abdomen:  Soft, non tender, no hepatosplenomegaly Pelvic:  External genitalia is normal in appearance, no lesions.  The vagina is normal in appearance. Urethra has no lesions or masses. The cervix is bulbous.Pap with GC/CHL performed.  Uterus is felt to be normal size, shape, and contour.  No adnexal masses or tenderness noted.Bladder is non tender, no masses felt. Extremities/musculoskeletal:  No swelling or varicosities noted, no clubbing or cyanosis Psych:  No mood changes, alert and cooperative,seems happy  [place] - 01/22/20 0950      Pregnancy Intention Screening   Does the patient want to become pregnant in the next year? No    Does the patient's partner want to become pregnant in the next year? No    Would the patient like to discuss  contraceptive options today? Yes      Contraception Wrap Up   Current Method No Contraceptive Precautions    End Method Oral Contraceptive    Contraception Counseling Provided Yes         Fall risk is low AA is 1 PHQ 9 score is 1 Examination chaperoned by 03/24/20 RN  Impression and Plan: 1. Encounter for gynecological examination with Papanicolaou smear of cervix Pap sent Physical in 1 year Pap in 3 if normal  2. Urine pregnancy test negative   3. Encounter for initial prescription of contraceptive pills Can start now and use condoms Meds ordered this encounter  Medications  . Norethindrone-Ethinyl Estradiol-Fe Biphas (LO LOESTRIN FE) 1 MG-10 MCG / 10 MCG tablet    Sig: Take 1 tablet by mouth daily. Take 1 daily by mouth    Dispense:  28 tablet    Refill:  12    BIN Jobe Marker, PCN CN, GRP [id number]    Order Specific Question:   Supervising Provider    Answer:   [id number] [[id number]]   Keep check on BP at work Follow up in 3 months for BP check and ROS

## 2020-01-23 LAB — CYTOLOGY - PAP
Adequacy: ABSENT
Chlamydia: NEGATIVE
Comment: NEGATIVE
Comment: NORMAL
Diagnosis: NEGATIVE
Neisseria Gonorrhea: POSITIVE — AB

## 2020-01-24 ENCOUNTER — Telehealth: Payer: Self-pay | Admitting: Adult Health

## 2020-01-24 ENCOUNTER — Encounter: Payer: Self-pay | Admitting: Adult Health

## 2020-01-24 ENCOUNTER — Encounter: Payer: Self-pay | Admitting: *Deleted

## 2020-01-24 ENCOUNTER — Ambulatory Visit (INDEPENDENT_AMBULATORY_CARE_PROVIDER_SITE_OTHER): Payer: Medicaid Other | Admitting: *Deleted

## 2020-01-24 DIAGNOSIS — A549 Gonococcal infection, unspecified: Secondary | ICD-10-CM | POA: Insufficient documentation

## 2020-01-24 HISTORY — DX: Gonococcal infection, unspecified: A54.9

## 2020-01-24 MED ORDER — CEFTRIAXONE SODIUM 250 MG IJ SOLR
500.0000 mg | Freq: Once | INTRAMUSCULAR | Status: AC
  Administered 2020-01-24: 500 mg via INTRAMUSCULAR

## 2020-01-24 MED ORDER — METRONIDAZOLE 500 MG PO TABS: 500.0000 mg | ORAL_TABLET | Freq: Two times a day (BID) | ORAL | 0 refills | Status: DC

## 2020-01-24 NOTE — Telephone Encounter (Signed)
Pt aware pap was negative for malignancy but +GC and BV, will rx flagyl for BV, and to come in today at 12:10 pm for injection of rocephin 500 mg, no sex POC in 2 weeks, partner to clinic and [facility] sent.

## 2020-01-24 NOTE — Progress Notes (Signed)
   NURSE VISIT- INJECTION  SUBJECTIVE:  Deborah Wells is a 24 y.o. G63P1001 female here for a Rocephin for treatment for gonorrhea. She is a GYN patient.   OBJECTIVE:  LMP 12/21/2019   Appears well, in no apparent distress  Injection administered in: Right upper quad. gluteus  Meds ordered this encounter  Medications  . cefTRIAXone (ROCEPHIN) injection 500 mg    ASSESSMENT: GYN patient Rocephin for treatment for gonorrhea PLAN: Follow-up: 2 weeks for POC   Malachy Mood  01/24/2020 12:43 PM

## 2020-02-06 ENCOUNTER — Other Ambulatory Visit (INDEPENDENT_AMBULATORY_CARE_PROVIDER_SITE_OTHER): Payer: Medicaid Other | Admitting: *Deleted

## 2020-02-06 ENCOUNTER — Other Ambulatory Visit (HOSPITAL_COMMUNITY)
Admission: RE | Admit: 2020-02-06 | Discharge: 2020-02-06 | Disposition: A | Discharge status: Home / Self Care | Payer: Medicaid Other | Source: Ambulatory Visit | Attending: Obstetrics and Gynecology | Admitting: Obstetrics and Gynecology

## 2020-02-06 DIAGNOSIS — A549 Gonococcal infection, unspecified: Secondary | ICD-10-CM | POA: Insufficient documentation

## 2020-02-06 NOTE — Progress Notes (Signed)
   NURSE VISIT-STD/POC  SUBJECTIVE:  Deborah Wells is a 24 y.o. G1P1001 GYN patientfemale here for a vaginal swab for proof of cure after treatment for Gonorrhea.  She reports the following symptoms: none for  1 week. Denies abnormal vaginal bleeding, significant pelvic pain, fever, or UTI symptoms.  OBJECTIVE:  There were no vitals taken for this visit.  Appears well, in no apparent distress  ASSESSMENT: Vaginal swab for proof of cure after treatment for GC  PLAN: Self-collected vaginal probe for Gonorrhea, Chlamydia sent to lab Treatment: to be determined once results are received Follow-up as needed if symptoms persist/worsen, or new symptoms develop Work note given for today.  Malachy Mood  02/06/2020 9:03 AM

## 2020-02-06 NOTE — Progress Notes (Signed)
Chart reviewed for nurse visit. Agree with plan of care.  Adline Potter, NP 02/06/2020 5:17 PM

## 2020-02-07 ENCOUNTER — Other Ambulatory Visit: Payer: Medicaid Other

## 2020-02-07 LAB — CERVICOVAGINAL ANCILLARY ONLY
Chlamydia: NEGATIVE
Comment: NEGATIVE
Comment: NORMAL
Neisseria Gonorrhea: NEGATIVE

## 2020-07-06 ENCOUNTER — Emergency Department (HOSPITAL_COMMUNITY)
Admission: EM | Admit: 2020-07-06 | Discharge: 2020-07-07 | Disposition: A | Discharge status: Home / Self Care | Payer: Medicaid Other | Attending: Emergency Medicine | Admitting: Emergency Medicine

## 2020-07-06 ENCOUNTER — Encounter (HOSPITAL_COMMUNITY): Payer: Self-pay | Admitting: *Deleted

## 2020-07-06 ENCOUNTER — Other Ambulatory Visit: Payer: Self-pay

## 2020-07-06 DIAGNOSIS — R300 Dysuria: Secondary | ICD-10-CM | POA: Insufficient documentation

## 2020-07-06 DIAGNOSIS — I10 Essential (primary) hypertension: Secondary | ICD-10-CM | POA: Diagnosis not present

## 2020-07-06 LAB — URINALYSIS, ROUTINE W REFLEX MICROSCOPIC
Bilirubin Urine: NEGATIVE
Glucose, UA: NEGATIVE mg/dL
Hgb urine dipstick: NEGATIVE
Ketones, ur: NEGATIVE mg/dL
Leukocytes,Ua: NEGATIVE
Nitrite: NEGATIVE
Protein, ur: NEGATIVE mg/dL
Specific Gravity, Urine: 1.024 (ref 1.005–1.030)
pH: 8 (ref 5.0–8.0)

## 2020-07-06 LAB — PREGNANCY, URINE: Preg Test, Ur: NEGATIVE

## 2020-07-06 LAB — WET PREP, GENITAL
Sperm: NONE SEEN
Trich, Wet Prep: NONE SEEN
Yeast Wet Prep HPF POC: NONE SEEN

## 2020-07-06 MED ORDER — DOXYCYCLINE HYCLATE 100 MG PO CAPS: 100.0000 mg | ORAL_CAPSULE | Freq: Two times a day (BID) | ORAL | 0 refills | Status: DC

## 2020-07-06 MED ORDER — METRONIDAZOLE 500 MG PO TABS: 500.0000 mg | ORAL_TABLET | Freq: Two times a day (BID) | ORAL | 0 refills | Status: DC

## 2020-07-06 MED ORDER — LIDOCAINE HCL (PF) 1 % IJ SOLN
INTRAMUSCULAR | Status: AC
  Filled 2020-07-06: qty 30

## 2020-07-06 MED ORDER — CEFTRIAXONE SODIUM 500 MG IJ SOLR
500.0000 mg | Freq: Once | INTRAMUSCULAR | Status: AC
  Administered 2020-07-06: 500 mg via INTRAMUSCULAR
  Filled 2020-07-06: qty 500

## 2020-07-06 NOTE — ED Provider Notes (Signed)
[facility] EMERGENCY DEPARTMENT Provider Note   CSN: [id number] Arrival date & time: 07/06/20  2105     History Chief Complaint  Patient presents with  . Dysuria    Deborah Wells is a 24 y.o. female with a history of hypertension, gonorrhea, and prior C-section who presents to the emergency department with complaints of dysuria over the past few days.  Patient states he is having dysuria, urgency, and frequency.  No alleviating or aggravating factors other than urination.  She denies fever, chills, nausea, vomiting, abdominal pain, flank pain, vaginal bleeding, or vaginal discharge.  Patient is sexually active.   HPI     Past Medical History:  Diagnosis Date  . Gonorrhea 01/24/2020   To get rocephin 7/9, POC________  . Hypertension    pregnancy hypertension  . Medical history non-contributory   . Vaginal Pap smear, abnormal     Patient Active Problem List   Diagnosis Date Noted  . Gonorrhea 01/24/2020  . Urine pregnancy test negative 01/22/2020  . Encounter for gynecological examination with Papanicolaou smear of cervix 01/22/2020  . Encounter for initial prescription of contraceptive pills 01/01/2019  . Frequent headaches 01/01/2019  . Pregnancy examination or test, negative result 01/01/2019  . S/P cesarean section 06/21/2016  . Gestational hypertension 06/19/2016  . S/P emergency cesarean section for fetal bradycardia 01/21/2016    Past Surgical History:  Procedure Laterality Date  . CESAREAN SECTION N/A 06/21/2016   Procedure: CESAREAN SECTION;  Surgeon: Tereso Newcomer, MD;  Location: [facility];  Service: Obstetrics;  Laterality: N/A;  . INSERTION OF NON VAGINAL CONTRACEPTIVE DEVICE  06/24/2016      . NO PAST SURGERIES       OB History    Gravida  1   Para  1   Term  1   Preterm      AB      Living  1     SAB      IAB      Ectopic      Multiple  0   Live Births  1           Family History  Problem Relation Age of  Onset  . Diabetes Mother   . Breast cancer Maternal Grandmother     Social History   Tobacco Use  . Smoking status: Never Smoker  . Smokeless tobacco: Never Used  Vaping Use  . Vaping Use: Never used  Substance Use Topics  . Alcohol use: No  . Drug use: No    Home Medications Prior to Admission medications   Medication Sig Start Date End Date Taking? Authorizing Provider  metroNIDAZOLE (FLAGYL) 500 MG tablet Take 1 tablet (500 mg total) by mouth 2 (two) times daily. Patient not taking: Reported on 01/24/2020 01/24/20   Adline Potter, NP  Norethindrone-Ethinyl Estradiol-Fe Biphas (LO LOESTRIN FE) 1 MG-10 MCG / 10 MCG tablet Take 1 tablet by mouth daily. Take 1 daily by mouth 01/22/20   Adline Potter, NP    Allergies    Patient has no known allergies.  Review of Systems   Review of Systems  Constitutional: Negative for chills and fever.  Cardiovascular: Negative for chest pain.  Gastrointestinal: Negative for abdominal pain, constipation, diarrhea, nausea and vomiting.  Genitourinary: Positive for dysuria, frequency and urgency. Negative for vaginal bleeding and vaginal discharge.  Neurological: Negative for syncope.  All other systems reviewed and are negative.   Physical Exam Updated Vital Signs BP 134/85 (  BP Location: Right Arm)   Temp 98.4 F (36.9 C) (Oral)   Resp 16   Ht 5\' 2"  (1.575 m)   Wt 68 kg   LMP 06/15/2020   SpO2 100%   BMI 27.44 kg/m   Physical Exam Vitals and nursing note reviewed.  Constitutional:      General: She is not in acute distress.    Appearance: She is well-developed. She is not toxic-appearing.  HENT:     Head: Normocephalic and atraumatic.  Eyes:     General:        Right eye: No discharge.        Left eye: No discharge.     Conjunctiva/sclera: Conjunctivae normal.  Cardiovascular:     Rate and Rhythm: Normal rate and regular rhythm.  Pulmonary:     Effort: Pulmonary effort is normal. No respiratory distress.      Breath sounds: Normal breath sounds. No wheezing, rhonchi or rales.  Abdominal:     General: There is no distension.     Palpations: Abdomen is soft.     Tenderness: There is no abdominal tenderness. There is no right CVA tenderness, left CVA tenderness, guarding or rebound.  Musculoskeletal:     Cervical back: Neck supple.  Skin:    General: Skin is warm and dry.     Findings: No rash.  Neurological:     Mental Status: She is alert.     Comments: Clear speech.   Psychiatric:        Behavior: Behavior normal.     ED Results / Procedures / Treatments   Labs (all labs ordered are listed, but only abnormal results are displayed) Labs Reviewed - No data to display  EKG None  Radiology No results found.  Procedures Procedures (including critical care time)  Medications Ordered in ED Medications  cefTRIAXone (ROCEPHIN) injection 500 mg (has no administration in time range)    ED Course  I have reviewed the triage vital signs and the nursing notes.  Pertinent labs & imaging results that were available during my care of the patient were reviewed by me and considered in my medical decision making (see chart for details).    MDM Rules/Calculators/A&P                          Patient presents to the ED with complaints of urinary sxs for the past few days.  She is nontoxic, resting comfortably, vitals are within normal limits.  Her pregnancy test is negative.  Her urinalysis does not show findings consistent with a UTI.  Urine also without glucosuria or ketonuria to raise concern for new onset diabetes.  She has no associated pain to raise concern for nephrolithiasis or ovarian torsion.  Given normal urinalysis and that she is currently sexually active we discussed proceeding with pelvic exam which she did not wish to do, however would self swab. Wet prep w/ BV. GC/chlamydia pending. tx w/ flagyl & prophylaxis for GC/chlamydia w/ rocephin & doxycycline.    We discussed abstinence  for at least 1 week following completion of antibiotics.  We also discussed the need to inform all sexual partners if tests are positive.  I discussed results, treatment plan, need for follow-up, and return precautions with the patient. Provided opportunity for questions, patient confirmed understanding and is in agreement with plan.   Final Clinical Impression(s) / ED Diagnoses Final diagnoses:  Dysuria    Rx / DC Orders ED  Discharge Orders         Ordered    doxycycline (VIBRAMYCIN) 100 MG capsule  2 times daily        07/06/20 2246    metroNIDAZOLE (FLAGYL) 500 MG tablet  2 times daily        07/06/20 [address], PA-C 07/06/20 2249    Bethann Berkshire, MD 07/07/20 1138

## 2020-07-06 NOTE — ED Triage Notes (Signed)
Burning with urination since Saturday.  Denies any fevers or N/V.  Lower abd cramping.

## 2020-07-06 NOTE — Discharge Instructions (Addendum)
You were seen in the emergency department today for urinary symptoms.  Your pregnancy test was negative.  Your urine sample did not show a urinary tract infection.  Your wet prep shows findings of bacterial vaginosis, please see attached handout.  We have tested you for gonorrhea and chlamydia, we will call you if these results are positive.  At this time we are treating you for bacterial vaginosis and also covering you for gonorrhea and chlamydia.  We are treating the bacterial vaginosis with Flagyl, take this twice per day for the next 1 week, do not drink alcohol when taking this medication as it can be very dangerous.  We gave you a shot of antibiotics and are sending you home with doxycycline to take twice per day for the next 1 week to cover for gonorrhea/chlamydia.  Do not have sex of any kind until at least 1 week after your last dose of antibiotics.  We will call you if your STD results are positive, if positive you will need to inform all sexual partners.  We have prescribed you new medication(s) today. Discuss the medications prescribed today with your pharmacist as they can have adverse effects and interactions with your other medicines including over the counter and prescribed medications. Seek medical evaluation if you start to experience new or abnormal symptoms after taking one of these medicines, seek care immediately if you start to experience difficulty breathing, feeling of your throat closing, facial swelling, or rash as these could be indications of a more serious allergic reaction  Please follow-up with your primary care provider or with the health department within 1 week.  Return to the ER for new or worsening symptoms including but not limited to fever, pelvic pain, inability to keep fluids down, or any other concerns.

## 2020-07-08 LAB — URINE CULTURE

## 2020-07-08 LAB — GC/CHLAMYDIA PROBE AMP (~~LOC~~) NOT AT ARMC
Chlamydia: POSITIVE — AB
Comment: NEGATIVE
Comment: NORMAL
Neisseria Gonorrhea: NEGATIVE

## 2021-03-15 ENCOUNTER — Other Ambulatory Visit: Payer: Self-pay

## 2021-03-15 ENCOUNTER — Other Ambulatory Visit (INDEPENDENT_AMBULATORY_CARE_PROVIDER_SITE_OTHER): Payer: Medicaid Other

## 2021-03-15 ENCOUNTER — Other Ambulatory Visit (HOSPITAL_COMMUNITY)
Admission: RE | Admit: 2021-03-15 | Discharge: 2021-03-15 | Disposition: A | Discharge status: Home / Self Care | Payer: Medicaid Other | Source: Ambulatory Visit | Attending: Obstetrics & Gynecology | Admitting: Obstetrics & Gynecology

## 2021-03-15 DIAGNOSIS — Z113 Encounter for screening for infections with a predominantly sexual mode of transmission: Secondary | ICD-10-CM | POA: Insufficient documentation

## 2021-03-15 NOTE — Progress Notes (Signed)
Chart reviewed for nurse visit. Agree with plan of care.  Adline Potter, NP 03/15/2021 3:19 PM

## 2021-03-15 NOTE — Progress Notes (Signed)
   NURSE VISIT- VAGINITIS/STD  SUBJECTIVE:  Deborah Wells is a 25 y.o. G1P1001 GYN patientfemale here for a vaginal swab for vaginitis screening, STD screen.  She reports the following symptoms: none Denies abnormal vaginal bleeding, significant pelvic pain, fever, or UTI symptoms.  OBJECTIVE:  There were no vitals taken for this visit.  Appears well, in no apparent distress  ASSESSMENT: Vaginal swab for  vaginitis & STD screening  PLAN: Self-collected vaginal probe for Gonorrhea, Chlamydia, Trichomonas, Bacterial Vaginosis, Yeast sent to lab Treatment: to be determined once results are received Follow-up as needed if symptoms persist/worsen, or new symptoms develop  Travante Knee A Ida Milbrath  03/15/2021 2:30 PM

## 2021-03-17 LAB — CERVICOVAGINAL ANCILLARY ONLY
Bacterial Vaginitis (gardnerella): POSITIVE — AB
Candida Glabrata: NEGATIVE
Candida Vaginitis: POSITIVE — AB
Chlamydia: NEGATIVE
Comment: NEGATIVE
Comment: NEGATIVE
Comment: NEGATIVE
Comment: NEGATIVE
Comment: NEGATIVE
Comment: NORMAL
Neisseria Gonorrhea: NEGATIVE
Trichomonas: NEGATIVE

## 2021-03-18 ENCOUNTER — Other Ambulatory Visit: Payer: Self-pay | Admitting: Adult Health

## 2021-03-18 MED ORDER — FLUCONAZOLE 150 MG PO TABS: ORAL_TABLET | ORAL | 1 refills | Status: DC

## 2021-03-18 MED ORDER — METRONIDAZOLE 500 MG PO TABS: 500.0000 mg | ORAL_TABLET | Freq: Two times a day (BID) | ORAL | 0 refills | Status: DC

## 2021-03-18 NOTE — Progress Notes (Signed)
Vaginal swab +BV and yeast, will rx flagyl and diflucan  

## 2021-07-02 ENCOUNTER — Other Ambulatory Visit: Payer: Self-pay

## 2021-07-02 ENCOUNTER — Emergency Department (HOSPITAL_COMMUNITY)
Admission: EM | Admit: 2021-07-02 | Discharge: 2021-07-03 | Disposition: A | Discharge status: Home / Self Care | Payer: Medicaid Other | Attending: Emergency Medicine | Admitting: Emergency Medicine

## 2021-07-02 ENCOUNTER — Encounter (HOSPITAL_COMMUNITY): Payer: Self-pay | Admitting: Emergency Medicine

## 2021-07-02 DIAGNOSIS — M79645 Pain in left finger(s): Secondary | ICD-10-CM | POA: Diagnosis present

## 2021-07-02 DIAGNOSIS — L03012 Cellulitis of left finger: Secondary | ICD-10-CM | POA: Diagnosis not present

## 2021-07-02 NOTE — ED Triage Notes (Signed)
Pt to the ED with complaints of left thumb pain and swelling x 2 days.  Pt denies known injury.

## 2021-07-03 MED ORDER — CEPHALEXIN 500 MG PO CAPS
500.0000 mg | ORAL_CAPSULE | Freq: Once | ORAL | Status: AC
  Administered 2021-07-03: 500 mg via ORAL
  Filled 2021-07-03: qty 1

## 2021-07-03 MED ORDER — HYDROCODONE-ACETAMINOPHEN 5-325 MG PO TABS
2.0000 | ORAL_TABLET | Freq: Once | ORAL | Status: AC
  Administered 2021-07-03: 2 via ORAL
  Filled 2021-07-03: qty 2

## 2021-07-03 MED ORDER — CEPHALEXIN 500 MG PO CAPS: 500.0000 mg | ORAL_CAPSULE | Freq: Four times a day (QID) | ORAL | 0 refills | Status: DC

## 2021-07-03 NOTE — Discharge Instructions (Addendum)
Begin taking Keflex as prescribed.  Apply warm compresses as frequently as possible for the next several days.  Return to the emergency department if you develop increased swelling, increased pain, red streaks extending up the arm, or for other new and concerning symptoms.

## 2021-07-03 NOTE — ED Provider Notes (Signed)
[facility] EMERGENCY DEPARTMENT Provider Note   CSN: [id number] Arrival date & time: 07/02/21  2255     History Chief Complaint  Patient presents with   Hand Problem    Deborah Wells is a 25 y.o. female.  Patient is a 25 year old female with no significant past medical history.  She presents today with complaints of left thumb pain and swelling that has worsened over the past 2 days.  She denies any injury or trauma.  Pain is worse with palpation and movement.  There are no alleviating factors.  The history is provided by the patient.      Past Medical History:  Diagnosis Date   Gonorrhea 01/24/2020   To get rocephin 7/9, POC________   Hypertension    pregnancy hypertension   Medical history non-contributory    Vaginal Pap smear, abnormal     Patient Active Problem List   Diagnosis Date Noted   Gonorrhea 01/24/2020   Urine pregnancy test negative 01/22/2020   Encounter for gynecological examination with Papanicolaou smear of cervix 01/22/2020   Encounter for initial prescription of contraceptive pills 01/01/2019   Frequent headaches 01/01/2019   Pregnancy examination or test, negative result 01/01/2019   S/P cesarean section 06/21/2016   Gestational hypertension 06/19/2016   S/P emergency cesarean section for fetal bradycardia 01/21/2016    Past Surgical History:  Procedure Laterality Date   CESAREAN SECTION N/A 06/21/2016   Procedure: CESAREAN SECTION;  Surgeon: Tereso Newcomer, MD;  Location: [facility];  Service: Obstetrics;  Laterality: N/A;   INSERTION OF NON VAGINAL CONTRACEPTIVE DEVICE  06/24/2016       NO PAST SURGERIES       OB History     Gravida  1   Para  1   Term  1   Preterm      AB      Living  1      SAB      IAB      Ectopic      Multiple  0   Live Births  1           Family History  Problem Relation Age of Onset   Diabetes Mother    Breast cancer Maternal Grandmother     Social History   Tobacco  Use   Smoking status: Never   Smokeless tobacco: Never  Vaping Use   Vaping Use: Never used  Substance Use Topics   Alcohol use: No   Drug use: No    Home Medications Prior to Admission medications   Medication Sig Start Date End Date Taking? Authorizing Provider  doxycycline (VIBRAMYCIN) 100 MG capsule Take 1 capsule (100 mg total) by mouth 2 (two) times daily. 07/06/20   Petrucelli, Pleas Koch, PA-C  fluconazole (DIFLUCAN) 150 MG tablet Take 1 now and 1 in 3 days 03/18/21   Adline Potter, NP  metroNIDAZOLE (FLAGYL) 500 MG tablet Take 1 tablet (500 mg total) by mouth 2 (two) times daily. 03/18/21   Adline Potter, NP  Norethindrone-Ethinyl Estradiol-Fe Biphas (LO LOESTRIN FE) 1 MG-10 MCG / 10 MCG tablet Take 1 tablet by mouth daily. Take 1 daily by mouth 01/22/20   Adline Potter, NP    Allergies    Patient has no known allergies.  Review of Systems   Review of Systems  All other systems reviewed and are negative.  Physical Exam Updated Vital Signs BP (!) 137/105 (BP Location: Right Arm)    Pulse  94    Temp 98.2 F (36.8 C) (Oral)    Resp 15    Ht 5\' 2"  (1.575 m)    Wt 68 kg    LMP 06/10/2021    SpO2 99%    BMI 27.42 kg/m   Physical Exam Vitals and nursing note reviewed.  Constitutional:      General: She is not in acute distress.    Appearance: Normal appearance. She is not ill-appearing.  HENT:     Head: Normocephalic and atraumatic.  Pulmonary:     Effort: Pulmonary effort is normal.  Skin:    General: Skin is warm and dry.     Comments: Adjacent to the left thumbnail is a paronychia.  There is erythema overlying the skin and thumb is tender to the touch.  Neurological:     Mental Status: She is alert.    ED Results / Procedures / Treatments   Labs (all labs ordered are listed, but only abnormal results are displayed) Labs Reviewed - No data to display  EKG None  Radiology No results found.  Procedures Procedures   Medications Ordered in  ED Medications - No data to display  ED Course  I have reviewed the triage vital signs and the nursing notes.  Pertinent labs & imaging results that were available during my care of the patient were reviewed by me and considered in my medical decision making (see chart for details).    MDM Rules/Calculators/A&P  Patient with paronychia to the left thumb.  This was incised and drained using an 11 blade and Betadine.  A significant quantity of purulent material was expressed.  Patient will be placed in a bandage, advised to soak as frequently as possible for the next several days.  She will also be treated with Keflex.  Final Clinical Impression(s) / ED Diagnoses Final diagnoses:  None    Rx / DC Orders ED Discharge Orders     None        06/12/2021, MD 07/03/21 0157

## 2022-06-08 ENCOUNTER — Ambulatory Visit: Payer: Medicaid Other | Admitting: Adult Health

## 2022-10-27 ENCOUNTER — Encounter (HOSPITAL_COMMUNITY): Payer: Self-pay | Admitting: *Deleted

## 2022-10-27 ENCOUNTER — Ambulatory Visit (HOSPITAL_COMMUNITY)
Admission: EM | Admit: 2022-10-27 | Discharge: 2022-10-27 | Disposition: A | Discharge status: Home / Self Care | Payer: Medicaid Other | Attending: Physician Assistant | Admitting: Physician Assistant

## 2022-10-27 DIAGNOSIS — N760 Acute vaginitis: Secondary | ICD-10-CM | POA: Insufficient documentation

## 2022-10-27 DIAGNOSIS — N898 Other specified noninflammatory disorders of vagina: Secondary | ICD-10-CM | POA: Diagnosis present

## 2022-10-27 MED ORDER — FLUCONAZOLE 150 MG PO TABS: ORAL_TABLET | ORAL | 0 refills | Status: DC

## 2022-10-27 NOTE — ED Triage Notes (Signed)
Pt states she has been having vaginal discharge and itching for 3 days. She states she hasn't used any meds.

## 2022-10-27 NOTE — Discharge Instructions (Signed)
I am concerned that you have a yeast infection.  Please take fluconazole today.  Take a second dose in 3 days if your symptoms do not resolve.  Wear loosefitting cotton underwear and use hypoallergenic soaps and detergents.  We will contact you if need to arrange additional treatment based on your swab result.  If anything changes or worsens please return for reevaluation.

## 2022-10-27 NOTE — ED Provider Notes (Signed)
[facility]    CSN: [id number] Arrival date & time: 10/27/22  1748      History   Chief Complaint Chief Complaint  Patient presents with   Vaginal Itching   Vaginal Discharge    HPI Deborah Wells is a 27 y.o. female.   Patient presents today with 3-day history of vaginal irritation.  She reports associated itching and burning with thick white discharge.  She denies any changes to personal hygiene products including soaps or detergents.  She denies any history of diabetes and does not take SGLT2 inhibitor.  Denies any urinary symptoms including frequency, urgency, dysuria.  Denies any pelvic pain, abdominal pain, fever, nausea, vomiting.  She is confident she is not pregnant.  She is sexually active with female partners but denies any specific concern for STI.  She denies history of recurrent yeast infections.  She has not tried any over-the-counter medication for symptom management.    Past Medical History:  Diagnosis Date   Gonorrhea 01/24/2020   To get rocephin 7/9, POC________   Hypertension    pregnancy hypertension   Medical history non-contributory    Vaginal Pap smear, abnormal     Patient Active Problem List   Diagnosis Date Noted   Gonorrhea 01/24/2020   Urine pregnancy test negative 01/22/2020   Encounter for gynecological examination with Papanicolaou smear of cervix 01/22/2020   Encounter for initial prescription of contraceptive pills 01/01/2019   Frequent headaches 01/01/2019   Pregnancy examination or test, negative result 01/01/2019   S/P cesarean section 06/21/2016   Gestational hypertension 06/19/2016   S/P emergency cesarean section for fetal bradycardia 01/21/2016    Past Surgical History:  Procedure Laterality Date   CESAREAN SECTION N/A 06/21/2016   Procedure: CESAREAN SECTION;  Surgeon: Tereso Newcomer, MD;  Location: [facility];  Service: Obstetrics;  Laterality: N/A;   INSERTION OF NON VAGINAL CONTRACEPTIVE DEVICE   06/24/2016       NO PAST SURGERIES      OB History     Gravida  1   Para  1   Term  1   Preterm      AB      Living  1      SAB      IAB      Ectopic      Multiple  0   Live Births  1            Home Medications    Prior to Admission medications   Medication Sig Start Date End Date Taking? Authorizing Provider  fluconazole (DIFLUCAN) 150 MG tablet Take 1 now and 1 in 3 days 10/27/22   Kyro Joswick, Noberto Retort, PA-C    Family History Family History  Problem Relation Age of Onset   Diabetes Mother    Breast cancer Maternal Grandmother     Social History Social History   Tobacco Use   Smoking status: Never   Smokeless tobacco: Never  Vaping Use   Vaping Use: Never used  Substance Use Topics   Alcohol use: No   Drug use: No     Allergies   Patient has no known allergies.   Review of Systems Review of Systems  Constitutional:  Negative for activity change, appetite change, fatigue and fever.  Gastrointestinal:  Negative for abdominal pain, diarrhea, nausea and vomiting.  Genitourinary:  Positive for vaginal discharge and vaginal pain (Irritation). Negative for dysuria, frequency, pelvic pain, urgency and vaginal bleeding.  Physical Exam Triage Vital Signs ED Triage Vitals  Enc Vitals Group     BP 10/27/22 1909 117/83     Pulse Rate 10/27/22 1909 91     Resp 10/27/22 1909 16     Temp 10/27/22 1909 98.5 F (36.9 C)     Temp Source 10/27/22 1909 Oral     SpO2 10/27/22 1909 99 %     Weight --      Height --      Head Circumference --      Peak Flow --      Pain Score 10/27/22 1907 0     Pain Loc --      Pain Edu? --      Excl. in GC? --    No data found.  Updated Vital Signs BP 117/83 (BP Location: Right Arm)   Pulse 91   Temp 98.5 F (36.9 C) (Oral)   Resp 16   LMP 10/10/2022 (Exact Date)   SpO2 99%   Visual Acuity Right Eye Distance:   Left Eye Distance:   Bilateral Distance:    Right Eye Near:   Left Eye Near:     Bilateral Near:     Physical Exam Vitals reviewed.  Constitutional:      General: She is awake. She is not in acute distress.    Appearance: Normal appearance. She is well-developed. She is not ill-appearing.     Comments: Very pleasant female appears stated age in no acute distress sitting comfortably in exam room  HENT:     Head: Normocephalic and atraumatic.  Cardiovascular:     Rate and Rhythm: Normal rate and regular rhythm.     Heart sounds: Normal heart sounds, S1 normal and S2 normal. No murmur heard. Pulmonary:     Effort: Pulmonary effort is normal.     Breath sounds: Normal breath sounds. No wheezing, rhonchi or rales.     Comments: Clear to auscultation bilaterally Abdominal:     General: Bowel sounds are normal.     Palpations: Abdomen is soft.     Tenderness: There is no abdominal tenderness. There is no right CVA tenderness, left CVA tenderness, guarding or rebound.     Comments: Benign abdominal exam  Psychiatric:        Behavior: Behavior is cooperative.      UC Treatments / Results  Labs (all labs ordered are listed, but only abnormal results are displayed) Labs Reviewed  CERVICOVAGINAL ANCILLARY ONLY    EKG   Radiology No results found.  Procedures Procedures (including critical care time)  Medications Ordered in UC Medications - No data to display  Initial Impression / Assessment and Plan / UC Course  I have reviewed the triage vital signs and the nursing notes.  Pertinent labs & imaging results that were available during my care of the patient were reviewed by me and considered in my medical decision making (see chart for details).     Patient is well-appearing, afebrile, nontoxic, nontachycardic.  Given clinical presentation concern for yeast vaginitis.  She was empirically treated with 1 dose of Diflucan today and will take second dose in 72 hours if symptoms are not resolved.  Recommended loosefitting cotton underwear and hypoallergenic  soaps and detergents.  Vaginal swab was obtained we will contact her if need to arrange additional treatment.  Discussed that if she has any worsening or changing symptoms including pelvic pain, abdominal pain, changing discharge, fever, nausea, vomiting she should be seen immediately.  Strict return precautions given.  Final Clinical Impressions(s) / UC Diagnoses   Final diagnoses:  Acute vaginitis  Vaginal irritation     Discharge Instructions      I am concerned that you have a yeast infection.  Please take fluconazole today.  Take a second dose in 3 days if your symptoms do not resolve.  Wear loosefitting cotton underwear and use hypoallergenic soaps and detergents.  We will contact you if need to arrange additional treatment based on your swab result.  If anything changes or worsens please return for reevaluation.    ED Prescriptions     Medication Sig Dispense Auth. Provider   fluconazole (DIFLUCAN) 150 MG tablet Take 1 now and 1 in 3 days 2 tablet Josslynn Mentzer K, PA-C      PDMP not reviewed this encounter.   Jeani HawkingRaspet, Ronnett Pullin K, PA-C 10/27/22 1952

## 2022-10-28 ENCOUNTER — Telehealth (HOSPITAL_COMMUNITY): Payer: Self-pay | Admitting: Emergency Medicine

## 2022-10-28 LAB — CERVICOVAGINAL ANCILLARY ONLY
Bacterial Vaginitis (gardnerella): POSITIVE — AB
Candida Glabrata: NEGATIVE
Candida Vaginitis: POSITIVE — AB
Chlamydia: NEGATIVE
Comment: NEGATIVE
Comment: NEGATIVE
Comment: NEGATIVE
Comment: NEGATIVE
Comment: NEGATIVE
Comment: NORMAL
Neisseria Gonorrhea: NEGATIVE
Trichomonas: NEGATIVE

## 2022-10-28 MED ORDER — METRONIDAZOLE 500 MG PO TABS: 500.0000 mg | ORAL_TABLET | Freq: Two times a day (BID) | ORAL | 0 refills | Status: DC

## 2022-11-19 ENCOUNTER — Other Ambulatory Visit: Payer: Self-pay

## 2022-11-19 ENCOUNTER — Emergency Department (HOSPITAL_COMMUNITY)
Admission: EM | Admit: 2022-11-19 | Discharge: 2022-11-19 | Disposition: A | Discharge status: Home / Self Care | Payer: Medicaid Other | Attending: Emergency Medicine | Admitting: Emergency Medicine

## 2022-11-19 ENCOUNTER — Emergency Department (HOSPITAL_COMMUNITY)
Admission: EM | Admit: 2022-11-19 | Discharge: 2022-11-19 | Disposition: A | Discharge status: Home / Self Care | Payer: Medicaid Other | Source: Home / Self Care | Attending: Emergency Medicine | Admitting: Emergency Medicine

## 2022-11-19 ENCOUNTER — Encounter (HOSPITAL_COMMUNITY): Payer: Self-pay

## 2022-11-19 DIAGNOSIS — M62838 Other muscle spasm: Secondary | ICD-10-CM | POA: Insufficient documentation

## 2022-11-19 DIAGNOSIS — Z5321 Procedure and treatment not carried out due to patient leaving prior to being seen by health care provider: Secondary | ICD-10-CM | POA: Diagnosis not present

## 2022-11-19 DIAGNOSIS — M79604 Pain in right leg: Secondary | ICD-10-CM | POA: Diagnosis not present

## 2022-11-19 LAB — PREGNANCY, URINE: Preg Test, Ur: NEGATIVE

## 2022-11-19 MED ORDER — IBUPROFEN 800 MG PO TABS: 800.0000 mg | ORAL_TABLET | Freq: Three times a day (TID) | ORAL | 0 refills | Status: DC | PRN

## 2022-11-19 MED ORDER — HYDROXYZINE HCL 25 MG PO TABS: ORAL_TABLET | ORAL | 0 refills | Status: DC

## 2022-11-19 NOTE — ED Triage Notes (Signed)
Pt. Stated, I have had restless right arm and leg for 2 weeks and unable to sleep.

## 2022-11-19 NOTE — ED Triage Notes (Signed)
Pt states that she is having pain in her R leg and unable to go sleep due to it, denies injury, states she can't sit still

## 2022-11-19 NOTE — ED Notes (Signed)
NA x4 vitals 

## 2022-11-19 NOTE — ED Provider Notes (Signed)
[place] EMERGENCY DEPARTMENT AT [facility] Provider Note   CSN: [id number] Arrival date & time: 11/19/22  4098     History  Chief Complaint  Patient presents with   restless right arm   restless right leg    Deborah Wells is a 27 y.o. female.  Patient reports she has discomfort in her right leg.  Patient reports the symptoms began 3 days ago.  Patient states that she feels like she continuously needs to move her right leg.  Patient denies any jerking.  She denies any pain.  Patient reports that she did not sleep well for the last 3 nights due to the discomfort.  Patient denies any swelling or pain she denies any numbness or tingling.  She denies any injury  The history is provided by the patient. No language interpreter was used.       Home Medications Prior to Admission medications   Medication Sig Start Date End Date Taking? Authorizing Provider  hydrOXYzine (ATARAX) 25 MG tablet One tablet at bedtime to help with sleep/leg jerking 11/19/22  Yes Elson Areas, PA-C  ibuprofen (ADVIL) 800 MG tablet Take 1 tablet (800 mg total) by mouth every 8 (eight) hours as needed. 11/19/22  Yes Cheron Schaumann K, PA-C  fluconazole (DIFLUCAN) 150 MG tablet Take 1 now and 1 in 3 days 10/27/22   Raspet, Denny Peon K, PA-C  metroNIDAZOLE (FLAGYL) 500 MG tablet Take 1 tablet (500 mg total) by mouth 2 (two) times daily. 10/28/22   Lamptey, Britta Mccreedy, MD      Allergies    Patient has no known allergies.    Review of Systems   Review of Systems  Musculoskeletal:  Negative for gait problem and myalgias.  All other systems reviewed and are negative.   Physical Exam Updated Vital Signs BP 125/89   Pulse 88   Temp 97.9 F (36.6 C) (Oral)   Resp 16   Ht 5\' 2"  (1.575 m)   Wt 68.9 kg   LMP 11/16/2022 (Exact Date)   SpO2 99%   BMI 27.80 kg/m  Physical Exam Vitals reviewed.  Constitutional:      Appearance: Normal appearance.  Cardiovascular:     Rate and Rhythm: Normal rate.   Pulmonary:     Effort: Pulmonary effort is normal.  Abdominal:     General: Abdomen is flat.  Musculoskeletal:        General: Tenderness present.     Comments: Patient has equal pulses bilateral lower extremities negative Homans there is no calf swelling.  Skin:    General: Skin is warm.  Neurological:     General: No focal deficit present.     Mental Status: She is alert and oriented to person, place, and time.     Cranial Nerves: No cranial nerve deficit.     Sensory: No sensory deficit.     Motor: No weakness.     Coordination: Coordination normal.  Psychiatric:        Mood and Affect: Mood normal.     ED Results / Procedures / Treatments   Labs (all labs ordered are listed, but only abnormal results are displayed) Labs Reviewed  PREGNANCY, URINE    EKG None  Radiology No results found.  Procedures Procedures    Medications Ordered in ED Medications - No data to display  ED Course/ Medical Decision Making/ A&P  Medical Decision Making Patient complains of the sensation that she needs to move her right leg.  Patient reports this has kept her up for the past 3 nights.  Risk Prescription drug management. Risk Details: Patient has a normal exam she has no DVT risk.  Patient does not have any neurologic changes.  I will try patient on ibuprofen for discomfort patient is also given a prescription for hydroxyzine to help her sleep tonight.  Patient is advised to follow-up with her primary care physician           Final Clinical Impression(s) / ED Diagnoses Final diagnoses:  Muscle spasm of right leg    Rx / DC Orders ED Discharge Orders          Ordered    ibuprofen (ADVIL) 800 MG tablet  Every 8 hours PRN        11/19/22 1211    hydrOXYzine (ATARAX) 25 MG tablet        11/19/22 1211           An After Visit Summary was printed and given to the patient.    Elson Areas, PA-C 11/19/22 1428    Margarita Grizzle, MD 11/19/22 [phone]

## 2022-11-19 NOTE — ED Notes (Signed)
NA x3 vitals 

## 2022-12-22 ENCOUNTER — Encounter: Payer: Medicaid Other | Admitting: Family Medicine

## 2023-02-08 ENCOUNTER — Ambulatory Visit (HOSPITAL_COMMUNITY)
Admission: EM | Admit: 2023-02-08 | Discharge: 2023-02-08 | Disposition: A | Discharge status: Home / Self Care | Payer: Medicaid Other | Attending: Nurse Practitioner | Admitting: Nurse Practitioner

## 2023-02-08 ENCOUNTER — Encounter (HOSPITAL_COMMUNITY): Payer: Self-pay | Admitting: Emergency Medicine

## 2023-02-08 DIAGNOSIS — N760 Acute vaginitis: Secondary | ICD-10-CM | POA: Insufficient documentation

## 2023-02-08 DIAGNOSIS — Z3202 Encounter for pregnancy test, result negative: Secondary | ICD-10-CM | POA: Diagnosis present

## 2023-02-08 LAB — POCT URINALYSIS DIP (MANUAL ENTRY)
Bilirubin, UA: NEGATIVE
Blood, UA: NEGATIVE
Glucose, UA: NEGATIVE mg/dL
Ketones, POC UA: NEGATIVE mg/dL
Nitrite, UA: NEGATIVE
Spec Grav, UA: 1.025 (ref 1.010–1.025)
Urobilinogen, UA: 2 E.U./dL — AB
pH, UA: 7 (ref 5.0–8.0)

## 2023-02-08 LAB — POCT URINE PREGNANCY: Preg Test, Ur: NEGATIVE

## 2023-02-08 MED ORDER — FLUCONAZOLE 150 MG PO TABS: 150.0000 mg | ORAL_TABLET | Freq: Once | ORAL | 0 refills | Status: AC

## 2023-02-08 NOTE — ED Triage Notes (Signed)
Pt c/o vaginal itching for a couple days. Discharge is normal per patient.

## 2023-02-08 NOTE — ED Provider Notes (Signed)
[facility]    CSN: [id number] Arrival date & time: 02/08/23  1141      History   Chief Complaint Chief Complaint  Patient presents with   Vaginal Itching    HPI Deborah Wells is a 27 y.o. female.   Patient presents today with 3-day history of vaginal itching and white, clumpy, thick vaginal discharge.  She reports is concerned about sores on her genitalia because she has been itching so much.  No known exposures to STI.  No pelvic pain, abdominal pain, nausea/vomiting, or fever.  Reports history of yeast infection and BV.  Has not take anything for symptoms so far.  She has a little bit of burning on the outside when she urinates, she thinks from the yeast infection.    Past Medical History:  Diagnosis Date   Gonorrhea 01/24/2020   To get rocephin 7/9, POC________   Hypertension    pregnancy hypertension   Medical history non-contributory    Vaginal Pap smear, abnormal     Patient Active Problem List   Diagnosis Date Noted   Gonorrhea 01/24/2020   Urine pregnancy test negative 01/22/2020   Encounter for gynecological examination with Papanicolaou smear of cervix 01/22/2020   Encounter for initial prescription of contraceptive pills 01/01/2019   Frequent headaches 01/01/2019   Pregnancy examination or test, negative result 01/01/2019   S/P cesarean section 06/21/2016   Gestational hypertension 06/19/2016   S/P emergency cesarean section for fetal bradycardia 01/21/2016    Past Surgical History:  Procedure Laterality Date   CESAREAN SECTION N/A 06/21/2016   Procedure: CESAREAN SECTION;  Surgeon: Tereso Newcomer, MD;  Location: [facility];  Service: Obstetrics;  Laterality: N/A;   INSERTION OF NON VAGINAL CONTRACEPTIVE DEVICE  06/24/2016       NO PAST SURGERIES      OB History     Gravida  1   Para  1   Term  1   Preterm      AB      Living  1      SAB      IAB      Ectopic      Multiple  0   Live Births  1             Home Medications    Prior to Admission medications   Medication Sig Start Date End Date Taking? Authorizing Provider  fluconazole (DIFLUCAN) 150 MG tablet Take 1 tablet (150 mg total) by mouth once for 1 dose. 02/08/23 02/08/23  Valentino Nose, NP  ibuprofen (ADVIL) 800 MG tablet Take 1 tablet (800 mg total) by mouth every 8 (eight) hours as needed. 11/19/22   Elson Areas, PA-C    Family History Family History  Problem Relation Age of Onset   Diabetes Mother    Breast cancer Maternal Grandmother     Social History Social History   Tobacco Use   Smoking status: Never   Smokeless tobacco: Never  Vaping Use   Vaping status: Never Used  Substance Use Topics   Alcohol use: No   Drug use: No     Allergies   Patient has no known allergies.   Review of Systems Review of Systems Per HPI  Physical Exam Triage Vital Signs ED Triage Vitals  Encounter Vitals Group     BP 02/08/23 1156 126/84     Systolic BP Percentile --      Diastolic BP Percentile --  Pulse Rate 02/08/23 1156 91     Resp 02/08/23 1156 14     Temp 02/08/23 1156 98.6 F (37 C)     Temp Source 02/08/23 1156 Oral     SpO2 02/08/23 1156 98 %     Weight --      Height --      Head Circumference --      Peak Flow --      Pain Score 02/08/23 1155 0     Pain Loc --      Pain Education --      Exclude from Growth Chart --    No data found.  Updated Vital Signs BP 126/84 (BP Location: Left Arm)   Pulse 91   Temp 98.6 F (37 C) (Oral)   Resp 14   LMP 01/22/2023   SpO2 98%   Visual Acuity Right Eye Distance:   Left Eye Distance:   Bilateral Distance:    Right Eye Near:   Left Eye Near:    Bilateral Near:     Physical Exam Vitals and nursing note reviewed. Exam conducted with a chaperone present Eliezer Lofts, Charity fundraiser).  Constitutional:      General: She is not in acute distress.    Appearance: Normal appearance. She is not toxic-appearing.  Pulmonary:     Effort: Pulmonary  effort is normal. No respiratory distress.  Genitourinary:    Exam position: Knee-chest position.     Pubic Area: No rash.      Labia:        Right: No rash, tenderness or lesion.        Left: No rash, tenderness or lesion.      Vagina: Vaginal discharge present. No lesions.     Cervix: Normal. No cervical motion tenderness.     Comments: Dried, white, thick, clumpy discharge noted to bilateral labia Lymphadenopathy:     Lower Body: No right inguinal adenopathy. No left inguinal adenopathy.  Skin:    General: Skin is warm and dry.     Coloration: Skin is not jaundiced or pale.     Findings: No erythema.  Neurological:     Mental Status: She is alert and oriented to person, place, and time.     Motor: No weakness.     Gait: Gait normal.  Psychiatric:        Behavior: Behavior is cooperative.      UC Treatments / Results  Labs (all labs ordered are listed, but only abnormal results are displayed) Labs Reviewed  POCT URINALYSIS DIP (MANUAL ENTRY) - Abnormal; Notable for the following components:      Result Value   Color, UA straw (*)    Clarity, UA hazy (*)    Protein Ur, POC trace (*)    Urobilinogen, UA 2.0 (*)    Leukocytes, UA Large (3+) (*)    All other components within normal limits  POCT URINE PREGNANCY  CERVICOVAGINAL ANCILLARY ONLY    EKG   Radiology No results found.  Procedures Procedures (including critical care time)  Medications Ordered in UC Medications - No data to display  Initial Impression / Assessment and Plan / UC Course  I have reviewed the triage vital signs and the nursing notes.  Pertinent labs & imaging results that were available during my care of the patient were reviewed by me and considered in my medical decision making (see chart for details).   Patient is well-appearing, normotensive, afebrile, not tachycardic, not tachypneic, oxygenating well  on room air.    1. Acute vaginitis Will treat for yeast infection with fluconazole  150 mg once UPT today negative Urinalysis without nitrites, 3+ leukocytes likely from vaginal discharge Vaginal cytology swab is pending-treat as indicated pending other than yeast infection  2. Urine pregnancy test negative  The patient was given the opportunity to ask questions.  All questions answered to their satisfaction.  The patient is in agreement to this plan.    Final Clinical Impressions(s) / UC Diagnoses   Final diagnoses:  Acute vaginitis  Urine pregnancy test negative     Discharge Instructions      Please take the fluconazole to treat for a yeast infection.  We will contact you if the swab from today tests positive for anything else.  Recommend condom use with every sexual encounter to prevent STI     ED Prescriptions     Medication Sig Dispense Auth. Provider   fluconazole (DIFLUCAN) 150 MG tablet Take 1 tablet (150 mg total) by mouth once for 1 dose. 1 tablet Valentino Nose, NP      PDMP not reviewed this encounter.   Valentino Nose, NP 02/08/23 1248

## 2023-02-08 NOTE — Discharge Instructions (Addendum)
Please take the fluconazole to treat for a yeast infection.  We will contact you if the swab from today tests positive for anything else.  Recommend condom use with every sexual encounter to prevent STI

## 2023-02-09 ENCOUNTER — Telehealth: Payer: Self-pay | Admitting: Emergency Medicine

## 2023-02-09 LAB — CERVICOVAGINAL ANCILLARY ONLY
Bacterial Vaginitis (gardnerella): POSITIVE — AB
Candida Glabrata: NEGATIVE
Chlamydia: NEGATIVE
Comment: NEGATIVE
Comment: NEGATIVE
Comment: NEGATIVE
Comment: NEGATIVE
Comment: NORMAL
Neisseria Gonorrhea: NEGATIVE
Trichomonas: NEGATIVE

## 2023-02-09 MED ORDER — METRONIDAZOLE 500 MG PO TABS: 500.0000 mg | ORAL_TABLET | Freq: Two times a day (BID) | ORAL | 0 refills | Status: DC

## 2023-03-20 ENCOUNTER — Ambulatory Visit (HOSPITAL_COMMUNITY)
Admission: EM | Admit: 2023-03-20 | Discharge: 2023-03-20 | Disposition: A | Discharge status: Home / Self Care | Payer: Medicaid Other | Attending: Family Medicine | Admitting: Family Medicine

## 2023-03-20 ENCOUNTER — Encounter (HOSPITAL_COMMUNITY): Payer: Self-pay | Admitting: *Deleted

## 2023-03-20 ENCOUNTER — Inpatient Hospital Stay (HOSPITAL_COMMUNITY)
Admission: AD | Admit: 2023-03-20 | Payer: Medicaid Other | Source: Home / Self Care | Admitting: Obstetrics and Gynecology

## 2023-03-20 DIAGNOSIS — R102 Pelvic and perineal pain: Secondary | ICD-10-CM | POA: Diagnosis not present

## 2023-03-20 DIAGNOSIS — Z3201 Encounter for pregnancy test, result positive: Secondary | ICD-10-CM

## 2023-03-20 LAB — POCT URINE PREGNANCY: Preg Test, Ur: POSITIVE — AB

## 2023-03-20 NOTE — Discharge Instructions (Signed)
The pregnancy test was positive  Patient will go to the maternal admissions unit for further evaluation due to her pelvic pain.

## 2023-03-20 NOTE — ED Provider Notes (Signed)
[facility]    CSN: [id number] Arrival date & time: 03/20/23  1756      History   Chief Complaint Chief Complaint  Patient presents with   Abdominal Pain    HPI Deborah Wells is a 27 y.o. female.    Abdominal Pain Here for suprapubic pain that is been going on for about a week.  Is been bad enough that it keeps her from sleeping.  No vomiting or diarrhea but she has been nauseated a good bit.  No fever or upper respiratory symptoms.  No dysuria.  Last menstrual cycle began August 7.  Past Medical History:  Diagnosis Date   Gonorrhea 01/24/2020   To get rocephin 7/9, POC________   Hypertension    pregnancy hypertension   Medical history non-contributory    Vaginal Pap smear, abnormal     Patient Active Problem List   Diagnosis Date Noted   Gonorrhea 01/24/2020   Urine pregnancy test negative 01/22/2020   Encounter for gynecological examination with Papanicolaou smear of cervix 01/22/2020   Encounter for initial prescription of contraceptive pills 01/01/2019   Frequent headaches 01/01/2019   Pregnancy examination or test, negative result 01/01/2019   S/P cesarean section 06/21/2016   Gestational hypertension 06/19/2016   S/P emergency cesarean section for fetal bradycardia 01/21/2016    Past Surgical History:  Procedure Laterality Date   CESAREAN SECTION N/A 06/21/2016   Procedure: CESAREAN SECTION;  Surgeon: Tereso Newcomer, MD;  Location: [facility];  Service: Obstetrics;  Laterality: N/A;   INSERTION OF NON VAGINAL CONTRACEPTIVE DEVICE  06/24/2016       NO PAST SURGERIES      OB History     Gravida  1   Para  1   Term  1   Preterm      AB      Living  1      SAB      IAB      Ectopic      Multiple  0   Live Births  1            Home Medications    Prior to Admission medications   Not on File    Family History Family History  Problem Relation Age of Onset   Diabetes Mother    Breast cancer  Maternal Grandmother     Social History Social History   Tobacco Use   Smoking status: Never   Smokeless tobacco: Never  Vaping Use   Vaping status: Never Used  Substance Use Topics   Alcohol use: No   Drug use: No     Allergies   Patient has no known allergies.   Review of Systems Review of Systems  Gastrointestinal:  Positive for abdominal pain.     Physical Exam Triage Vital Signs ED Triage Vitals [03/20/23 1812]  Encounter Vitals Group     BP (!) 138/90     Systolic BP Percentile      Diastolic BP Percentile      Pulse Rate 91     Resp 18     Temp 98.8 F (37.1 C)     Temp Source Oral     SpO2 99 %     Weight      Height      Head Circumference      Peak Flow      Pain Score 6     Pain Loc      Pain Education  Exclude from Growth Chart    No data found.  Updated Vital Signs BP (!) 138/90 (BP Location: Right Arm)   Pulse 91   Temp 98.8 F (37.1 C) (Oral)   Resp 18   LMP 02/22/2023 (Exact Date)   SpO2 99%   Visual Acuity Right Eye Distance:   Left Eye Distance:   Bilateral Distance:    Right Eye Near:   Left Eye Near:    Bilateral Near:     Physical Exam Vitals reviewed.  Constitutional:      General: She is not in acute distress.    Appearance: She is not ill-appearing, toxic-appearing or diaphoretic.  HENT:     Mouth/Throat:     Mouth: Mucous membranes are moist.     Pharynx: No oropharyngeal exudate or posterior oropharyngeal erythema.  Eyes:     Extraocular Movements: Extraocular movements intact.     Conjunctiva/sclera: Conjunctivae normal.     Pupils: Pupils are equal, round, and reactive to light.  Cardiovascular:     Rate and Rhythm: Normal rate and regular rhythm.     Heart sounds: No murmur heard. Pulmonary:     Effort: Pulmonary effort is normal. No respiratory distress.     Breath sounds: No stridor. No wheezing, rhonchi or rales.  Abdominal:     Palpations: Abdomen is soft.     Tenderness: There is abdominal  tenderness (Mild, suprapubic).  Musculoskeletal:     Cervical back: Neck supple.  Lymphadenopathy:     Cervical: No cervical adenopathy.  Skin:    Capillary Refill: Capillary refill takes less than 2 seconds.     Coloration: Skin is not jaundiced or pale.  Neurological:     General: No focal deficit present.     Mental Status: She is alert and oriented to person, place, and time.  Psychiatric:        Behavior: Behavior normal.      UC Treatments / Results  Labs (all labs ordered are listed, but only abnormal results are displayed) Labs Reviewed  POCT URINE PREGNANCY - Abnormal; Notable for the following components:      Result Value   Preg Test, Ur Positive (*)    All other components within normal limits    EKG   Radiology No results found.  Procedures Procedures (including critical care time)  Medications Ordered in UC Medications - No data to display  Initial Impression / Assessment and Plan / UC Course  I have reviewed the triage vital signs and the nursing notes.  Pertinent labs & imaging results that were available during my care of the patient were reviewed by me and considered in my medical decision making (see chart for details).        UPT is positive.  Patient requested a repeat and repeat was still positive.  She will proceed to the maternal admissions unit for further evaluation. Final Clinical Impressions(s) / UC Diagnoses   Final diagnoses:  Pelvic pain in female  Positive pregnancy test     Discharge Instructions      The pregnancy test was positive  Patient will go to the maternal admissions unit for further evaluation due to her pelvic pain.     ED Prescriptions   None    PDMP not reviewed this encounter.   Zenia Resides, MD 03/20/23 [phone]

## 2023-03-20 NOTE — ED Notes (Signed)
Patient is being discharged from the Urgent Care and sent to the Emergency Department via POV . Per Dr Marlinda Mike, patient is in need of higher level of care due to pregnancy, abd pain . Patient is aware and verbalizes understanding of plan of care.  Vitals:   03/20/23 1812  BP: (!) 138/90  Pulse: 91  Resp: 18  Temp: 98.8 F (37.1 C)  SpO2: 99%

## 2023-03-20 NOTE — ED Triage Notes (Signed)
Pt states she has abd pain for the last week and its hard for her to sleep. She hasn't taken any meds for sx.

## 2023-03-21 ENCOUNTER — Encounter (HOSPITAL_COMMUNITY): Payer: Self-pay

## 2023-03-21 ENCOUNTER — Inpatient Hospital Stay (HOSPITAL_COMMUNITY)
Admission: AD | Admit: 2023-03-21 | Discharge: 2023-03-21 | Discharge status: Against Medical Advice | Payer: Medicaid Other | Attending: Obstetrics and Gynecology | Admitting: Obstetrics and Gynecology

## 2023-03-21 DIAGNOSIS — Z3A00 Weeks of gestation of pregnancy not specified: Secondary | ICD-10-CM | POA: Diagnosis not present

## 2023-03-21 DIAGNOSIS — O208 Other hemorrhage in early pregnancy: Secondary | ICD-10-CM | POA: Diagnosis present

## 2023-03-21 NOTE — MAU Note (Signed)
Patient not in lobby. Called x3.

## 2023-03-27 ENCOUNTER — Ambulatory Visit (INDEPENDENT_AMBULATORY_CARE_PROVIDER_SITE_OTHER): Payer: Medicaid Other | Admitting: Emergency Medicine

## 2023-03-27 VITALS — BP 135/86 | HR 97 | Ht 62.0 in | Wt 149.5 lb

## 2023-03-27 DIAGNOSIS — Z3201 Encounter for pregnancy test, result positive: Secondary | ICD-10-CM

## 2023-03-27 DIAGNOSIS — N912 Amenorrhea, unspecified: Secondary | ICD-10-CM | POA: Diagnosis not present

## 2023-03-27 LAB — POCT URINE PREGNANCY: Preg Test, Ur: POSITIVE — AB

## 2023-03-27 NOTE — Progress Notes (Signed)
Ms. Rumford presents today for UPT. She has no unusual complaints and headaches and cramping. LMP: 02/20/2023    OBJECTIVE: Appears well, in no apparent distress.  OB History     Gravida  2   Para  1   Term  1   Preterm      AB      Living  1      SAB      IAB      Ectopic      Multiple  0   Live Births  1          Home UPT Result: Positive In-Office UPT result: Positive I have reviewed the patient's medical, obstetrical, social, and family histories, and medications.   ASSESSMENT: Positive pregnancy test  PLAN Prenatal care to be completed at: [facility]

## 2023-03-28 ENCOUNTER — Inpatient Hospital Stay (HOSPITAL_COMMUNITY)
Admission: AD | Admit: 2023-03-28 | Discharge: 2023-03-28 | Disposition: A | Discharge status: Home / Self Care | Payer: Medicaid Other | Attending: Obstetrics & Gynecology | Admitting: Obstetrics & Gynecology

## 2023-03-28 NOTE — Progress Notes (Signed)
Pt called and not in lobby 

## 2023-03-28 NOTE — Progress Notes (Signed)
Pt called by Morrie Sheldon with lab and pt not in lobby

## 2023-03-28 NOTE — Progress Notes (Signed)
Called pt in lobby and another waiting pt stated Deborah Wells left.

## 2023-04-11 ENCOUNTER — Inpatient Hospital Stay (HOSPITAL_COMMUNITY)
Admission: AD | Admit: 2023-04-11 | Discharge: 2023-04-11 | Disposition: A | Discharge status: Home / Self Care | Payer: Medicaid Other | Attending: Obstetrics and Gynecology | Admitting: Obstetrics and Gynecology

## 2023-04-11 ENCOUNTER — Inpatient Hospital Stay (HOSPITAL_COMMUNITY): Payer: Medicaid Other

## 2023-04-11 DIAGNOSIS — O99611 Diseases of the digestive system complicating pregnancy, first trimester: Secondary | ICD-10-CM | POA: Diagnosis not present

## 2023-04-11 DIAGNOSIS — O219 Vomiting of pregnancy, unspecified: Secondary | ICD-10-CM

## 2023-04-11 DIAGNOSIS — Z3A01 Less than 8 weeks gestation of pregnancy: Secondary | ICD-10-CM

## 2023-04-11 DIAGNOSIS — O26891 Other specified pregnancy related conditions, first trimester: Secondary | ICD-10-CM | POA: Diagnosis not present

## 2023-04-11 LAB — CBC WITH DIFFERENTIAL/PLATELET
Abs Immature Granulocytes: 0.02 10*3/uL (ref 0.00–0.07)
Basophils Absolute: 0.1 10*3/uL (ref 0.0–0.1)
Basophils Relative: 1 %
Eosinophils Absolute: 0.1 10*3/uL (ref 0.0–0.5)
Eosinophils Relative: 1 %
HCT: 37.1 % (ref 36.0–46.0)
Hemoglobin: 11.8 g/dL — ABNORMAL LOW (ref 12.0–15.0)
Immature Granulocytes: 0 %
Lymphocytes Relative: 25 %
Lymphs Abs: 1.8 10*3/uL (ref 0.7–4.0)
MCH: 27 pg (ref 26.0–34.0)
MCHC: 31.8 g/dL (ref 30.0–36.0)
MCV: 84.9 fL (ref 80.0–100.0)
Monocytes Absolute: 0.7 10*3/uL (ref 0.1–1.0)
Monocytes Relative: 10 %
Neutro Abs: 4.6 10*3/uL (ref 1.7–7.7)
Neutrophils Relative %: 63 %
Platelets: 344 10*3/uL (ref 150–400)
RBC: 4.37 MIL/uL (ref 3.87–5.11)
RDW: 13.3 % (ref 11.5–15.5)
WBC: 7.2 10*3/uL (ref 4.0–10.5)
nRBC: 0 % (ref 0.0–0.2)

## 2023-04-11 LAB — COMPREHENSIVE METABOLIC PANEL
ALT: 11 U/L (ref 0–44)
AST: 15 U/L (ref 15–41)
Albumin: 3.5 g/dL (ref 3.5–5.0)
Alkaline Phosphatase: 46 U/L (ref 38–126)
Anion gap: 11 (ref 5–15)
BUN: 9 mg/dL (ref 6–20)
CO2: 22 mmol/L (ref 22–32)
Calcium: 8.7 mg/dL — ABNORMAL LOW (ref 8.9–10.3)
Chloride: 101 mmol/L (ref 98–111)
Creatinine, Ser: 0.71 mg/dL (ref 0.44–1.00)
GFR, Estimated: >60 mL/min (ref >60)
Glucose, Bld: 79 mg/dL (ref 70–99)
Potassium: 3.6 mmol/L (ref 3.5–5.1)
Sodium: 134 mmol/L — ABNORMAL LOW (ref 135–145)
Total Bilirubin: 0.9 mg/dL (ref 0.3–1.2)
Total Protein: 7.3 g/dL (ref 6.5–8.1)

## 2023-04-11 LAB — URINALYSIS, ROUTINE W REFLEX MICROSCOPIC
Bilirubin Urine: NEGATIVE
Glucose, UA: NEGATIVE mg/dL
Hgb urine dipstick: NEGATIVE
Ketones, ur: NEGATIVE mg/dL
Leukocytes,Ua: NEGATIVE
Nitrite: NEGATIVE
Protein, ur: NEGATIVE mg/dL
Specific Gravity, Urine: 1.021 (ref 1.005–1.030)
pH: 5 (ref 5.0–8.0)

## 2023-04-11 LAB — WET PREP, GENITAL
Clue Cells Wet Prep HPF POC: NONE SEEN
Sperm: NONE SEEN
Trich, Wet Prep: NONE SEEN
WBC, Wet Prep HPF POC: <10 (ref <10)
Yeast Wet Prep HPF POC: NONE SEEN

## 2023-04-11 LAB — HCG, QUANTITATIVE, PREGNANCY: hCG, Beta Chain, Quant, S: 52748 m[IU]/mL — ABNORMAL HIGH (ref <5)

## 2023-04-11 LAB — HIV ANTIBODY (ROUTINE TESTING W REFLEX): HIV Screen 4th Generation wRfx: NONREACTIVE

## 2023-04-11 MED ORDER — ONDANSETRON HCL 8 MG PO TABS: 8.0000 mg | ORAL_TABLET | Freq: Three times a day (TID) | ORAL | 1 refills | Status: DC | PRN

## 2023-04-11 MED ORDER — FAMOTIDINE IN NACL 20-0.9 MG/50ML-% IV SOLN
20.0000 mg | Freq: Once | INTRAVENOUS | Status: AC
  Administered 2023-04-11: 20 mg via INTRAVENOUS
  Filled 2023-04-11: qty 50

## 2023-04-11 MED ORDER — LACTATED RINGERS IV BOLUS
1000.0000 mL | Freq: Once | INTRAVENOUS | Status: AC
  Administered 2023-04-11: 1000 mL via INTRAVENOUS

## 2023-04-11 MED ORDER — SODIUM CHLORIDE 0.9 % IV SOLN
8.0000 mg | Freq: Once | INTRAVENOUS | Status: AC
  Administered 2023-04-11: 8 mg via INTRAVENOUS
  Filled 2023-04-11: qty 4

## 2023-04-11 NOTE — Discharge Instructions (Signed)

## 2023-04-11 NOTE — MAU Provider Note (Signed)
History     CSN: [id number]  Arrival date and time: 04/11/23 1121   Event Date/Time   First Provider Initiated Contact with Patient 04/11/23 1219      Chief Complaint  Patient presents with   Nausea   Emesis   Headache   HPI  [name] is a 27 y.o. female G2P1001 @ [redacted]w[redacted]d here in MAU with complaints of nausea and vomiting. She reports not being able to keep anything down for the last 24 hours. She has not been able to eat or drink anything. She has not tried any medication for the symptoms. She reports some mild lower abdominal pain that hurts only at night, and usually after she vomiting. She has no bleeding. She has started prenatal care at [facility].   OB History     Gravida  2   Para  1   Term  1   Preterm      AB      Living  1      SAB      IAB      Ectopic      Multiple  0   Live Births  1           Past Medical History:  Diagnosis Date   Gonorrhea 01/24/2020   To get rocephin 7/9, POC________   Hypertension    pregnancy hypertension   Medical history non-contributory    Vaginal Pap smear, abnormal     Past Surgical History:  Procedure Laterality Date   CESAREAN SECTION N/A 06/21/2016   Procedure: CESAREAN SECTION;  Surgeon: Tereso Newcomer, MD;  [facility]: [facility];  Service: Obstetrics;  Laterality: N/A;   INSERTION OF NON VAGINAL CONTRACEPTIVE DEVICE  06/24/2016       NO PAST SURGERIES      Family History  Problem Relation Age of Onset   Diabetes Mother    Breast cancer Maternal Grandmother     Social History   Tobacco Use   Smoking status: Never   Smokeless tobacco: Never  Vaping Use   Vaping status: Never Used  Substance Use Topics   Alcohol use: No   Drug use: No    Allergies: No Known Allergies  No medications prior to admission.   Results for orders placed or performed during the hospital encounter of 04/11/23 (from the past 48 hour(s))  CBC with Differential/Platelet     Status: Abnormal    Collection Time: 04/11/23 11:53 AM  Result Value Ref Range   WBC 7.2 4.0 - 10.5 K/uL   RBC 4.37 3.87 - 5.11 MIL/uL   Hemoglobin 11.8 (L) 12.0 - 15.0 g/dL   HCT 40.9 81.1 - 91.4 %   MCV 84.9 80.0 - 100.0 fL   MCH 27.0 26.0 - 34.0 pg   MCHC 31.8 30.0 - 36.0 g/dL   RDW 78.2 95.6 - 21.3 %   Platelets 344 150 - 400 K/uL   nRBC 0.0 0.0 - 0.2 %   Neutrophils Relative % 63 %   Neutro Abs 4.6 1.7 - 7.7 K/uL   Lymphocytes Relative 25 %   Lymphs Abs 1.8 0.7 - 4.0 K/uL   Monocytes Relative 10 %   Monocytes Absolute 0.7 0.1 - 1.0 K/uL   Eosinophils Relative 1 %   Eosinophils Absolute 0.1 0.0 - 0.5 K/uL   Basophils Relative 1 %   Basophils Absolute 0.1 0.0 - 0.1 K/uL   Immature Granulocytes 0 %   Abs Immature Granulocytes 0.02 0.00 -  0.07 K/uL    Comment: Performed at [facility], [address] [place], [place] [address]  hCG, quantitative, pregnancy     Status: Abnormal   Collection Time: 04/11/23 11:59 AM  Result Value Ref Range   hCG, Beta Chain, Quant, S 52,748 (H) <5 mIU/mL    Comment:          GEST. AGE      CONC.  (mIU/mL)   <=1 WEEK        5 - 50     2 WEEKS       50 - 500     3 WEEKS       100 - 10,000     4 WEEKS     1,000 - 30,000     5 WEEKS     3,500 - 115,000   6-8 WEEKS     12,000 - 270,000    12 WEEKS     15,000 - 220,000        FEMALE AND NON-PREGNANT FEMALE:     LESS THAN 5 mIU/mL Performed at [facility], [address] [place], [place] [address]   Wet prep, genital     Status: None   Collection Time: 04/11/23 12:06 PM   Specimen: Vaginal  Result Value Ref Range   Yeast Wet Prep HPF POC NONE SEEN NONE SEEN   Trich, Wet Prep NONE SEEN NONE SEEN   Clue Cells Wet Prep HPF POC NONE SEEN NONE SEEN   WBC, Wet Prep HPF POC <10 <10   Sperm NONE SEEN     Comment: Performed at [facility], [address], [place], [place] [address]  Urinalysis, Routine w reflex microscopic -Urine, Clean Catch     Status: Abnormal   Collection Time: 04/11/23 12:18  PM  Result Value Ref Range   Color, Urine YELLOW YELLOW   APPearance HAZY (A) CLEAR   Specific Gravity, Urine 1.021 1.005 - 1.030   pH 5.0 5.0 - 8.0   Glucose, UA NEGATIVE NEGATIVE mg/dL   Hgb urine dipstick NEGATIVE NEGATIVE   Bilirubin Urine NEGATIVE NEGATIVE   Ketones, ur NEGATIVE NEGATIVE mg/dL   Protein, ur NEGATIVE NEGATIVE mg/dL   Nitrite NEGATIVE NEGATIVE   Leukocytes,Ua NEGATIVE NEGATIVE    Comment: Performed at [facility], [address], [place], [place] [address]    US OB LESS THAN 14 WEEKS WITH [place] TRANSVAGINAL  Result Date: 04/11/2023 CLINICAL DATA:  [id number] Abdominal cramping affecting pregnancy [id number] EXAM: OBSTETRIC <14 WK [place] AND TRANSVAGINAL OB US TECHNIQUE: Ob ultrasound was performed for complete evaluation of the gestation as well as the maternal uterus, adnexal regions, and pelvic cul-de-sac. COMPARISON:  None Available. FINDINGS: Intrauterine gestational sac: Single Yolk sac:  Visualized. Embryo:  Visualized. Cardiac Activity: Visualized. Heart Rate: 124 bpm CRL:   0.6 cm = 6 weeks 3 days [place] EDC: 12/02/2023 Subchorionic hemorrhage:  None visualized. Adnexa: No masses or fluid collections.  Minimal free fluid. IMPRESSION: Single viable intrauterine pregnancy measuring 6 weeks 3 days with ultrasound [facility] 12/02/2023. Electronically Signed   By: Layla Maw M.D.   On: 04/11/2023 14:28     Review of Systems  Constitutional:  Negative for fever.  Gastrointestinal:  Positive for abdominal pain, nausea and vomiting.   Physical Exam   Blood pressure 129/72, pulse 100, temperature 98.8 F (37.1 C), temperature source Oral, resp. rate 15, last menstrual period 02/20/2023, SpO2 99%.  Physical Exam Constitutional:  General: She is not in acute distress.    Appearance: She is well-developed. She is not ill-appearing, toxic-appearing or diaphoretic.  Pulmonary:     Effort: Pulmonary effort is normal.  Abdominal:     Palpations: Abdomen is soft.      Tenderness: There is no abdominal tenderness.  Musculoskeletal:     Cervical back: Normal range of motion.  Skin:    General: Skin is warm.  Neurological:     Mental Status: She is alert and oriented to person, place, and time.     GCS: GCS eye subscore is 4. GCS verbal subscore is 5. GCS motor subscore is 6.    MAU Course  Procedures  MDM  UA without signs of severe dehydration.  Lr bolus x 1 along with Pepcid IV and Zofran IV Tolerating oral fluids and reports feeling much better.   Assessment and Plan   A:  1. Nausea and vomiting in pregnancy   2. [redacted] weeks gestation of pregnancy   3. Abdominal pain in pregnancy, first trimester      P:  Dc home Return if symptoms worsen Rx: Zofran, warning constipation Small, frequent meals.    Duane Lope, NP 04/11/2023 2:40 PM

## 2023-04-11 NOTE — MAU Note (Addendum)
.  Deborah Wells is a 27 y.o. at [redacted]w[redacted]d here in MAU reporting: N/V and HA for the past three days. She reports she has been unable to eat or drink and keep things down. Intermittent lower abdominal cramps that began two days ago that "feels like severe period cramps" at night. Denies VB, vaginal discharge, vaginal itching, and vaginal odors.  -Last threw up this morning. Reports constant nausea. Has not eaten since yesterday and immediately vomited. -Has not had an [place] yet. First OB appointment 10/1 with [facility].  LMP: 02/20/2023 Onset of complaint: x3 days Pain score: Denies pain.   FHT: n/a Lab orders placed from triage: UA

## 2023-04-12 LAB — GC/CHLAMYDIA PROBE AMP (~~LOC~~) NOT AT ARMC
Chlamydia: NEGATIVE
Comment: NEGATIVE
Comment: NORMAL
Neisseria Gonorrhea: NEGATIVE

## 2023-04-14 ENCOUNTER — Inpatient Hospital Stay (HOSPITAL_COMMUNITY)
Admission: AD | Admit: 2023-04-14 | Discharge: 2023-04-14 | Disposition: A | Discharge status: Home / Self Care | Payer: Medicaid Other | Attending: Obstetrics and Gynecology | Admitting: Obstetrics and Gynecology

## 2023-04-14 ENCOUNTER — Other Ambulatory Visit: Payer: Self-pay

## 2023-04-14 DIAGNOSIS — Z3A01 Less than 8 weeks gestation of pregnancy: Secondary | ICD-10-CM | POA: Diagnosis not present

## 2023-04-14 DIAGNOSIS — R519 Headache, unspecified: Secondary | ICD-10-CM | POA: Insufficient documentation

## 2023-04-14 DIAGNOSIS — O219 Vomiting of pregnancy, unspecified: Secondary | ICD-10-CM | POA: Diagnosis present

## 2023-04-14 DIAGNOSIS — O26891 Other specified pregnancy related conditions, first trimester: Secondary | ICD-10-CM | POA: Diagnosis not present

## 2023-04-14 DIAGNOSIS — R109 Unspecified abdominal pain: Secondary | ICD-10-CM | POA: Diagnosis not present

## 2023-04-14 DIAGNOSIS — Z3A08 8 weeks gestation of pregnancy: Secondary | ICD-10-CM

## 2023-04-14 LAB — URINALYSIS, ROUTINE W REFLEX MICROSCOPIC
Bilirubin Urine: NEGATIVE
Glucose, UA: NEGATIVE mg/dL
Hgb urine dipstick: NEGATIVE
Ketones, ur: 20 mg/dL — AB
Nitrite: NEGATIVE
Protein, ur: NEGATIVE mg/dL
Specific Gravity, Urine: 1.026 (ref 1.005–1.030)
pH: 5 (ref 5.0–8.0)

## 2023-04-14 MED ORDER — SCOPOLAMINE 1 MG/3DAYS TD PT72: 1.0000 | MEDICATED_PATCH | TRANSDERMAL | 0 refills | Status: DC

## 2023-04-14 MED ORDER — PROCHLORPERAZINE MALEATE 5 MG PO TABS: 5.0000 mg | ORAL_TABLET | Freq: Four times a day (QID) | ORAL | 0 refills | Status: DC | PRN

## 2023-04-14 MED ORDER — ACETAMINOPHEN-CAFFEINE 500-65 MG PO TABS
2.0000 | ORAL_TABLET | Freq: Once | ORAL | Status: AC
  Administered 2023-04-14: 2 via ORAL
  Filled 2023-04-14: qty 2

## 2023-04-14 MED ORDER — DOXYLAMINE-PYRIDOXINE 10-10 MG PO TBEC: 2.0000 | DELAYED_RELEASE_TABLET | Freq: Every day | ORAL | 0 refills | Status: DC

## 2023-04-14 MED ORDER — PROCHLORPERAZINE MALEATE 10 MG PO TABS
10.0000 mg | ORAL_TABLET | Freq: Once | ORAL | Status: AC
  Administered 2023-04-14: 10 mg via ORAL
  Filled 2023-04-14: qty 1

## 2023-04-14 NOTE — Progress Notes (Signed)
Reports nausea better and she's feeling much better.

## 2023-04-14 NOTE — MAU Provider Note (Cosign Needed Addendum)
History   CSN: [id number]  Arrival date and time: 04/14/23 1225   Event Date/Time   First Provider Initiated Contact with Patient 04/14/23 1400      Chief Complaint  Patient presents with   Nausea   Headache   Emesis   Deborah Wells is a 27 y.o. G2P1001 @ [redacted]w[redacted]d here in MAU with complaints of nausea, vomiting, headache, and abdominal cramping. She reports these symptoms started about 4 days ago and have persisted despite trying different medications like Tylenol and Zofran. She feels nauseous upon waking in the morning and only finds relief when she falls back asleep at night. She last vomited this morning. Although she has a good appetite, she has not eaten anything in 2-3 days as the thought of food makes her nauseous. She tries to drink water periodically but is unable to tolerate this at times. Regarding the abdominal pain, she describes this as a cramping-like pain that comes with being hungry. She denies diarrhea, constipation, heartburn, vaginal bleeding or discharge, dysuria, fever, or chills. She also reports headaches that come and go, currently rating pain as 10/10. No vision changes. She tried taking one Tylenol tablet yesterday without relief.  Past Medical History:  Diagnosis Date   Gonorrhea 01/24/2020   To get rocephin 7/9, POC________   Hypertension    pregnancy hypertension   Medical history non-contributory    Vaginal Pap smear, abnormal     Past Surgical History:  Procedure Laterality Date   CESAREAN SECTION N/A 06/21/2016   Procedure: CESAREAN SECTION;  Surgeon: Tereso Newcomer, MD;  Location: [facility];  Service: Obstetrics;  Laterality: N/A;   INSERTION OF NON VAGINAL CONTRACEPTIVE DEVICE  06/24/2016       NO PAST SURGERIES      Family History  Problem Relation Age of Onset   Diabetes Mother    Breast cancer Maternal Grandmother     Social History   Tobacco Use   Smoking status: Never   Smokeless tobacco: Never  Vaping Use   Vaping  status: Never Used  Substance Use Topics   Alcohol use: No   Drug use: No    Allergies: No Known Allergies  Medications Prior to Admission  Medication Sig Dispense Refill Last Dose   ondansetron (ZOFRAN) 8 MG tablet Take 1 tablet (8 mg total) by mouth every 8 (eight) hours as needed for nausea or vomiting. 20 tablet 1     Review of Systems  Constitutional:  Negative for chills and fever.  Eyes:  Negative for photophobia and visual disturbance.  Respiratory:  Negative for shortness of breath.   Cardiovascular:  Negative for chest pain.  Gastrointestinal:  Positive for abdominal pain, nausea and vomiting. Negative for constipation and diarrhea.  Genitourinary:  Negative for difficulty urinating, dysuria, urgency, vaginal bleeding and vaginal discharge.  Neurological:  Positive for headaches.   Physical Exam   Blood pressure 128/72, pulse 91, temperature 98 F (36.7 C), temperature source Oral, resp. rate 18, height 5\' 2"  (1.575 m), weight 67.9 kg, last menstrual period 02/20/2023, SpO2 100%.  Physical Exam Vitals and nursing note reviewed.  Constitutional:      General: She is not in acute distress.    Appearance: She is not ill-appearing.  HENT:     Head: Normocephalic and atraumatic.  Cardiovascular:     Rate and Rhythm: Normal rate and regular rhythm.  Pulmonary:     Effort: Pulmonary effort is normal. No respiratory distress.     Breath sounds: Normal  breath sounds.  Abdominal:     General: There is no distension.     Tenderness: There is no guarding.  Neurological:     Mental Status: She is alert and oriented to person, place, and time.  Psychiatric:        Mood and Affect: Mood normal.        Speech: Speech normal.        Behavior: Behavior normal.    MAU Course   MDM  - UA with 20 ketones, trace leukocytes - Received prochlorperazine x 1, Excedrin x 1 - Headache (pain now 5/10) and nausea improved - Requested oral fluids and crackers, tolerating  well  Assessment and Plan  Nausea and vomiting in pregnancy - Prescription sent for prochlorperazine - Patient will purchase OTC Excedrin for headaches and Vitamin B6/Unisom for nausea - Recommend small, frequent meals - Return to MAU if symptoms worsen  [redacted] weeks gestation of pregnancy - Continue routine PNC at [facility] - Recommend patient start prenatal vitamins  [name] 04/14/2023, 2:11 PM   Attestation of Supervision of Student:  I confirm that I have verified the information documented in the medical student's note and that I have also personally performed the history, physical exam and all medical decision making activities.  I have verified that all services and findings are accurately documented in this student's note; and I agree with management and plan as outlined in the documentation. I have also made any necessary editorial changes.  27 y.o. G2P1001 at [redacted]w[redacted]d who presents with headache, nausea/vomiting, abd pain. Headache better with Excedrin Tension -- she reports h/o pre-e in previous pregnancy -- discussed with her this is quite early for pre-e and pt is normotensive. Vital signs stable and exam benign. Nausea resolved with Compazine, will Rx Compazine at discharge. Pt passed PO trial. Stable for d/c.   [name], MD [facility] 04/14/2023 4:02 PM

## 2023-04-14 NOTE — Discharge Instructions (Addendum)
FOR NAUSEA AND VOMITING: You can buy Unisom (Doxylamine) 25mg  tablets and Vitamin B6 (usually sold in 100mg  tablets) daily -- take half a tablet of Unisom (12.5mg  dose) and a tablet of Vitamin B6 every night. This would be in place of Diclegis if Diclegis is not covered by your insurance.  **The Unisom may make you drowsy! Please do not take this if you are going to be operating heavy machinery**    FOR HEADACHES:  You can take Excedrin Tension (Acetaminophen and Caffeine) -- avoid formulations that include Aspirin

## 2023-04-14 NOTE — MAU Note (Signed)
Deborah Wells is a 28 y.o. at [redacted]w[redacted]d here in MAU reporting: she has nausea, intermittent vomiting and a HA.  Reports  vomits even when doesn't eat.    States has had HA for the past 4 days, last took Tylenol 1 tab yesterday and no relief.  States has vomited twice in past 24 hours.  Denies VB. LMP: NA Onset of complaint: 2 weeks ago Pain score: 10 Vitals:   04/14/23 1256  BP: 128/72  Pulse: 91  Resp: 18  Temp: 98 F (36.7 C)  SpO2: 100%     FHT:NA Lab orders placed from triage:   UA

## 2023-04-18 ENCOUNTER — Ambulatory Visit (INDEPENDENT_AMBULATORY_CARE_PROVIDER_SITE_OTHER): Payer: Medicaid Other | Admitting: *Deleted

## 2023-04-18 VITALS — BP 110/69 | HR 84 | Wt 152.0 lb

## 2023-04-18 DIAGNOSIS — Z1339 Encounter for screening examination for other mental health and behavioral disorders: Secondary | ICD-10-CM

## 2023-04-18 DIAGNOSIS — Z3A08 8 weeks gestation of pregnancy: Secondary | ICD-10-CM

## 2023-04-18 DIAGNOSIS — O0991 Supervision of high risk pregnancy, unspecified, first trimester: Secondary | ICD-10-CM

## 2023-04-18 DIAGNOSIS — O0990 Supervision of high risk pregnancy, unspecified, unspecified trimester: Secondary | ICD-10-CM | POA: Insufficient documentation

## 2023-04-18 MED ORDER — PREPLUS 27-1 MG PO TABS
1.0000 | ORAL_TABLET | Freq: Every day | ORAL | 13 refills | Status: DC
Start: 2023-04-18 — End: 2023-05-24

## 2023-04-18 MED ORDER — BLOOD PRESSURE KIT DEVI: 1.0000 | 0 refills | Status: DC

## 2023-04-18 NOTE — Progress Notes (Signed)
Pt had dating and viability scan 04/11/23 in MAU. Informal BS [place] today. Fetal heart motion observed. FHR 158. Informal CRL and fetal appearance consistent with established EDD.

## 2023-04-18 NOTE — Progress Notes (Signed)
New OB Intake  I connected with Deborah Wells  on 04/18/23 at 10:15 AM EDT by In Person Visit and verified that I am speaking with the correct person using two identifiers. Nurse is located at [facility] and pt is located at [place].  I discussed the limitations, risks, security and privacy concerns of performing an evaluation and management service by telephone and the availability of in person appointments. I also discussed with the patient that there may be a patient responsible charge related to this service. The patient expressed understanding and agreed to proceed.  I explained I am completing New OB Intake today. We discussed EDD of 11/27/2023, by Last Menstrual Period. Pt is G2P1001. I reviewed her allergies, medications and Medical/Surgical/OB history.    Patient Active Problem List   Diagnosis Date Noted   Supervision of high risk pregnancy, antepartum 04/18/2023   Gonorrhea 01/24/2020   Urine pregnancy test negative 01/22/2020   Encounter for gynecological examination with Papanicolaou smear of cervix 01/22/2020   Encounter for initial prescription of contraceptive pills 01/01/2019   Frequent headaches 01/01/2019   Pregnancy examination or test, negative result 01/01/2019   S/P cesarean section 06/21/2016   Gestational hypertension 06/19/2016   S/P emergency cesarean section for fetal bradycardia 01/21/2016    Concerns addressed today  Delivery Plans Plans to deliver at [facility]. Discussed the nature of our practice with multiple providers including residents and students. Due to the size of the practice, the delivering provider may not be the same as those providing prenatal care.   Patient is not a candidate for water birth. Offered upcoming OB visit with CNM to discuss further.  MyChart/Babyscripts MyChart access verified. I explained pt will have some visits in office and some virtually. Babyscripts instructions given and order placed. Patient verifies receipt of  registration text/e-mail. Account successfully created and app downloaded.  Blood Pressure Cuff/Weight Scale Blood pressure cuff ordered for patient to pick-up from [employer]. Explained after first prenatal appt pt will check weekly and document in Babyscripts. Patient does not have weight scale; patient may purchase if they desire to track weight weekly in Babyscripts.  Anatomy US Explained first scheduled [place] will be around 19 weeks. Anatomy US scheduled for TBD at TBD.  Interested in [place]? If yes, send referral and doula dot phrase.   Is patient a candidate for Babyscripts Optimization? No - High Risk  First visit review I reviewed new OB appt with patient. Explained pt will be seen by Deborah Grates, NP at first visit. Discussed Deborah Wells genetic screening with patient. Requests Panorama and Horizon. Routine prenatal labs  OB Urine, GC/CC collected today.    Last Pap Diagnosis  Date Value Ref Range Status  01/22/2020   Final   - Negative for intraepithelial lesion or malignancy (NILM)    [name], RN 04/18/2023  11:52 AM

## 2023-04-18 NOTE — Patient Instructions (Signed)
[facility] [employer] has a partnership with the [employer] to provide prenatal navigation for the most needed resources in our community. In order to see how we can help connect you to these resources we need consent to contact you. Please complete the very short consent using the link below:   English Link: https://[place].tfaforms.net/283?site=16  Spanish Link: https://[place].tfaforms.net/287?site=16  Options for Doula Care in the [place] Area  As you review your birthing options, consider having a birth doula. A doula is trained to provide support before, during and just after you give birth. There are also postpartum doulas that help you adjust to new parenthood.  While doulas do not provide medical care, they do provide emotional, physical and educational support. A few months before your baby arrives, doulas can help answer questions, ease concerns and help you create and support your birthing plan.    Doulas can help reduce your stress and comfort you and your partner. They can help you cope with labor by helping you use breathing techniques, massage, creative labor positioning, essential oils and affirmations.   Studies show that the benefits of having a doula include:   A more positive birth experience  Fewer requests for pain-relief medication  Less likelihood of cesarean section, commonly called a c-section   Doulas are typically hired via a [employer] account planner between you and the doula. We are happy to provide a list of the most active doulas in the area, all of whom are credentialed by [facility] and will not count as a visitor at your birth.  There are several options for no-cost doula care at our hospital, including:  [facility] Volunteer Doula Program Every [employer] Program A Cure 4 Moms Doula Study (available only at [employer] for Women, [place], [place] and [employer] [facility] offices)  For more information on these programs or to receive  a list of doulas active in our area, please email [email or web address]  Our practice his participating in a study that provides no-cost doula care. ACURE4Moms is a study looking at how doula care can reduce birthing disparities for Black and brown birthing people. We like to refer patients as soon as possible, but definitely before 28 weeks so patients can get to know their doula.    A doula is trained to provide support before, during and just after you give birth. While doulas do not provide medical care, they do provide emotional, physical and educational support. Doulas can help reduce your stress and comfort you and your partner. They can help you cope with labor by helping you use breathing techniques, massage, creative labor positioning, essential oils and affirmations.   ACURE4Moms is a research study trying to reduce:   low birthweight babies  emergency department visits & hospitalizations for birthing persons and their babies  depression among birthing people  discrimination in pregnancy-related care ACURE4Moms is trying out 2 programs designed by  people who have given birth. These programs include: 1. Sharing patient data and warning alerts with clinic staff to keep them accountable for their patients' outcomes and providing tools to help them  reduce bias in care. 2. Matching eligible patients with doulas from the  same community as the patients.  If you would like to participate in this study, please visit:   http://carroll-castaneda.info/

## 2023-04-19 ENCOUNTER — Other Ambulatory Visit (HOSPITAL_COMMUNITY)
Admission: RE | Admit: 2023-04-19 | Discharge: 2023-04-19 | Disposition: A | Discharge status: Home / Self Care | Payer: Medicaid Other | Source: Ambulatory Visit | Attending: Obstetrics and Gynecology | Admitting: Obstetrics and Gynecology

## 2023-04-19 DIAGNOSIS — O0990 Supervision of high risk pregnancy, unspecified, unspecified trimester: Secondary | ICD-10-CM | POA: Insufficient documentation

## 2023-04-19 NOTE — Addendum Note (Signed)
Addended by: [name] on: 04/19/2023 11:16 AM   Modules accepted: Orders

## 2023-04-20 LAB — URINE CULTURE, OB REFLEX

## 2023-04-20 LAB — CULTURE, OB URINE

## 2023-04-24 LAB — CERVICOVAGINAL ANCILLARY ONLY
Chlamydia: NEGATIVE
Comment: NEGATIVE
Comment: NORMAL
Neisseria Gonorrhea: NEGATIVE

## 2023-05-10 ENCOUNTER — Ambulatory Visit: Payer: Medicaid Other | Admitting: Obstetrics and Gynecology

## 2023-05-10 ENCOUNTER — Encounter: Payer: Self-pay | Admitting: Obstetrics and Gynecology

## 2023-05-10 ENCOUNTER — Other Ambulatory Visit (HOSPITAL_COMMUNITY)
Admission: RE | Admit: 2023-05-10 | Discharge: 2023-05-10 | Disposition: A | Discharge status: Home / Self Care | Payer: Medicaid Other | Source: Ambulatory Visit | Attending: Obstetrics and Gynecology | Admitting: Obstetrics and Gynecology

## 2023-05-10 VITALS — BP 137/81 | HR 100 | Wt 154.8 lb

## 2023-05-10 DIAGNOSIS — Z8759 Personal history of other complications of pregnancy, childbirth and the puerperium: Secondary | ICD-10-CM | POA: Diagnosis not present

## 2023-05-10 DIAGNOSIS — Z98891 History of uterine scar from previous surgery: Secondary | ICD-10-CM | POA: Diagnosis not present

## 2023-05-10 DIAGNOSIS — O0991 Supervision of high risk pregnancy, unspecified, first trimester: Secondary | ICD-10-CM | POA: Diagnosis not present

## 2023-05-10 DIAGNOSIS — O26891 Other specified pregnancy related conditions, first trimester: Secondary | ICD-10-CM

## 2023-05-10 DIAGNOSIS — Z3A11 11 weeks gestation of pregnancy: Secondary | ICD-10-CM | POA: Diagnosis not present

## 2023-05-10 DIAGNOSIS — O0990 Supervision of high risk pregnancy, unspecified, unspecified trimester: Secondary | ICD-10-CM

## 2023-05-10 DIAGNOSIS — R519 Headache, unspecified: Secondary | ICD-10-CM

## 2023-05-10 MED ORDER — ASPIRIN 81 MG PO TBEC
81.0000 mg | DELAYED_RELEASE_TABLET | Freq: Every day | ORAL | 2 refills | Status: DC
Start: 2023-05-10 — End: 2023-11-02

## 2023-05-10 MED ORDER — EXCEDRIN TENSION HEADACHE 500-65 MG PO TABS: 2.0000 | ORAL_TABLET | Freq: Four times a day (QID) | ORAL | 0 refills | Status: DC | PRN

## 2023-05-10 MED ORDER — PRENATAL ADULT GUMMY/DHA/FA 0.4-25 MG PO CHEW: CHEWABLE_TABLET | ORAL | 3 refills | Status: AC

## 2023-05-10 NOTE — Progress Notes (Signed)
INITIAL PRENATAL VISIT  Subjective:   Deborah Wells is being seen today for her first obstetrical visit. She is at [redacted]w[redacted]d gestation by LMP Her obstetrical history is significant for  history of cesarean section and gestional hypertension . Relationship with FOB: . Patient does intend to breast feed. Pregnancy history fully reviewed.  Patient reports headache, every day as soon as she wakes up. Denies visual changes.    Indications for early GDM screening  First-degree relative with diabetes No BMI >30kg/m2 No Age > 25 Yes Previous birth of an infant weighing >=4000 g No Gestational diabetes mellitus in a previous pregnancy No Glycated hemoglobin >=5.7 percent (39 mmol/mol), impaired glucose tolerance, or impaired fasting glucose on previous testing No High-risk race/ethnicity (eg, African American, Latino, Native American, [place] American, Pacific Islander) Yes   Early screening tests: FBS, A1C, Random CBG, glucose challenge   Objective:    Obstetric History OB History  Gravida Para Term Preterm AB Living  2 1 1  0 0 1  SAB IAB Ectopic Multiple Live Births  0 0 0 0 1    # Outcome Date GA Lbr Len/2nd Weight Sex Type Anes PTL Lv  2 Current           1 Term 06/21/16 [redacted]w[redacted]d  4 lb 11.8 oz (2.15 kg) F CS-LTranv Gen  LIV    Past Medical History:  Diagnosis Date   Gonorrhea 01/24/2020   To get rocephin 7/9, POC________   Hypertension    pregnancy hypertension   Vaginal Pap smear, abnormal     Past Surgical History:  Procedure Laterality Date   CESAREAN SECTION N/A 06/21/2016   Procedure: CESAREAN SECTION;  Surgeon: Tereso Newcomer, MD;  [facility]: [facility];  Service: Obstetrics;  Laterality: N/A;   INSERTION OF NON VAGINAL CONTRACEPTIVE DEVICE  06/24/2016        Current Outpatient Medications on File Prior to Visit  Medication Sig Dispense Refill   Blood Pressure Monitoring (BLOOD PRESSURE KIT) DEVI 1 Device by Does not apply route once a week. 1 each 0    Doxylamine-Pyridoxine 10-10 MG TBEC Take 2 tablets by mouth at bedtime. 60 tablet 0   prochlorperazine (COMPAZINE) 5 MG tablet Take 1 tablet (5 mg total) by mouth every 6 (six) hours as needed for nausea or vomiting. 30 tablet 0   scopolamine (TRANSDERM-SCOP) 1 MG/3DAYS Place 1 patch (1.5 mg total) onto the skin every 3 (three) days. 10 patch 0   Prenatal Vit-Fe Fumarate-FA (PREPLUS) 27-1 MG TABS Take 1 tablet by mouth daily. (Patient not taking: Reported on 05/10/2023) 30 tablet 13   No current facility-administered medications on file prior to visit.    No Known Allergies  Social History:  reports that she has never smoked. She has never used smokeless tobacco. She reports that she does not drink alcohol and does not use drugs.  Family History  Problem Relation Age of Onset   Diabetes Mother    Breast cancer Maternal Grandmother     The following portions of the patient's history were reviewed and updated as appropriate: allergies, current medications, past family history, past medical history, past social history, past surgical history and problem list.  Review of Systems Review of Systems  All other systems reviewed and are negative.   Physical Exam:  BP 137/81   Pulse 100   Wt 154 lb 12.8 oz (70.2 kg)   LMP 02/20/2023 (Exact Date)   BMI 28.31 kg/m  CONSTITUTIONAL: Well-developed, well-nourished female in  no acute distress.  HENT:  Normocephalic, atraumatic.   EYES: Conjunctivae normal.  NECK: Normal range of motion  SKIN: Skin is warm and dry MUSCULOSKELETAL: Normal range of motion. NEUROLOGIC: Alert and oriented to person, place, and time. Normal muscle tone coordination.  PSYCHIATRIC: Normal mood and affect. Normal behavior. Normal judgment and thought content. CARDIOVASCULAR: Normal heart rate noted, regular rhythm RESPIRATORY: Normal effort ABDOMEN: Soft PELVIC: Normal appearing external genitalia; normal appearing vaginal mucosa and cervix.  No abnormal  discharge noted.  Pap smear obtained.  Normal uterine size, no other palpable masses, no uterine or adnexal tenderness.  Fetal Heart Rate (bpm): 178   Movement: Absent       Assessment:    Pregnancy: G2P1001  1. Supervision of high risk pregnancy, antepartum BP and FHR normal  2. History of gestational hypertension Normotensive today, baseline labs. Precautions discussed  3. History of cesarean section Emergency C/s for NR FHR, unsure if wants tolac or RCS   4. [redacted] weeks gestation of pregnancy Discussed recommendation for aspirin during pregnancy, rx sent. Instructed to start at 12 weeks Initial labs drawn. Prenatal vitamins. Problem list reviewed and updated. Reviewed in detail the nature of the practice with collaborative care between  Genetic screening discussed: NIPS/First trimester screen/Quad/AFP ordered. Role of ultrasound in pregnancy discussed; Anatomy US: ordered.  - Cytology - PAP( [place]) - Culture, OB Urine - [facility] Basic Panel - PANORAMA PRENATAL TEST - Prenatal MV & Min w/FA-DHA (PRENATAL ADULT GUMMY/DHA/FA) 0.4-25 MG CHEW; Take one pill daily  Dispense: 90 tablet; Refill: 3 - CBC/D/Plt+RPR+Rh+ABO+RubIgG... - HgB A1c - Comprehensive metabolic panel - Protein / creatinine ratio, urine - aspirin EC 81 MG tablet; Take 1 tablet (81 mg total) by mouth daily. Start taking when you are [redacted] weeks pregnant for rest of pregnancy for prevention of preeclampsia  Dispense: 300 tablet; Refill: 2  5. Pregnancy headache in first trimester Encouraged hydration, discussed magnesium daily, trial excedrine tension, precautions discussed when to follow up   acetaminophen-caffeine (EXCEDRIN TENSION HEADACHE) 500-65 MG TABS per tablet; Take 2 tablets by mouth every 6 (six) hours as needed.  Dispense: 60 tablet; Refill: 0   Follow up in 4 weeks. Discussed clinic routines, schedule of care and testing, genetic screening options, involvement of students and residents under the  direct supervision of APPs and doctors and presence of female providers. Pt verbalized understanding.   Return in 4 weeks for routine OB visit  Future Appointments  Date Time Provider Department Center  07/04/2023 10:15 AM [facility] NURSE [facility]  07/04/2023 10:30 AM [facility] US1 [facility]    Sue Lush, FNP

## 2023-05-10 NOTE — Progress Notes (Signed)
Pt presents for NOB visit. Pt c/o nausea with PNV, requesting gummies. No concerns

## 2023-05-11 LAB — CBC/D/PLT+RPR+RH+ABO+RUBIGG...
Antibody Screen: NEGATIVE
Basophils Absolute: 0.1 10*3/uL (ref 0.0–0.2)
Basos: 1 %
EOS (ABSOLUTE): 0.1 10*3/uL (ref 0.0–0.4)
Eos: 2 %
HCV Ab: NONREACTIVE
HIV Screen 4th Generation wRfx: NONREACTIVE
Hematocrit: 35.7 % (ref 34.0–46.6)
Hemoglobin: 11.4 g/dL (ref 11.1–15.9)
Hepatitis B Surface Ag: NEGATIVE
Immature Grans (Abs): 0 10*3/uL (ref 0.0–0.1)
Immature Granulocytes: 0 %
Lymphocytes Absolute: 1.3 10*3/uL (ref 0.7–3.1)
Lymphs: 23 %
MCH: 27.7 pg (ref 26.6–33.0)
MCHC: 31.9 g/dL (ref 31.5–35.7)
MCV: 87 fL (ref 79–97)
Monocytes Absolute: 0.5 10*3/uL (ref 0.1–0.9)
Monocytes: 9 %
Neutrophils Absolute: 3.8 10*3/uL (ref 1.4–7.0)
Neutrophils: 65 %
Platelets: 367 10*3/uL (ref 150–450)
RBC: 4.12 x10E6/uL (ref 3.77–5.28)
RDW: 12.7 % (ref 11.7–15.4)
RPR Ser Ql: NONREACTIVE
Rh Factor: POSITIVE
Rubella Antibodies, IGG: 1.12 {index} (ref >0.99)
WBC: 5.8 10*3/uL (ref 3.4–10.8)

## 2023-05-11 LAB — COMPREHENSIVE METABOLIC PANEL
ALT: 11 [IU]/L (ref 0–32)
AST: 11 [IU]/L (ref 0–40)
Albumin: 3.9 g/dL — ABNORMAL LOW (ref 4.0–5.0)
Alkaline Phosphatase: 48 [IU]/L (ref 44–121)
BUN/Creatinine Ratio: 16 (ref 9–23)
BUN: 10 mg/dL (ref 6–20)
Bilirubin Total: 0.3 mg/dL (ref 0.0–1.2)
CO2: 22 mmol/L (ref 20–29)
Calcium: 9.4 mg/dL (ref 8.7–10.2)
Chloride: 100 mmol/L (ref 96–106)
Creatinine, Ser: 0.61 mg/dL (ref 0.57–1.00)
Globulin, Total: 2.9 g/dL (ref 1.5–4.5)
Glucose: 72 mg/dL (ref 70–99)
Potassium: 4 mmol/L (ref 3.5–5.2)
Sodium: 135 mmol/L (ref 134–144)
Total Protein: 6.8 g/dL (ref 6.0–8.5)
eGFR: 126 mL/min/{1.73_m2} (ref >59)

## 2023-05-11 LAB — HEMOGLOBIN A1C
Est. average glucose Bld gHb Est-mCnc: 114 mg/dL
Hgb A1c MFr Bld: 5.6 % (ref 4.8–5.6)

## 2023-05-11 LAB — HCV INTERPRETATION

## 2023-05-11 LAB — PROTEIN / CREATININE RATIO, URINE
Creatinine, Urine: 129.3 mg/dL
Protein, Ur: 12.4 mg/dL
Protein/Creat Ratio: 96 mg/g{creat} (ref 0–200)

## 2023-05-12 LAB — URINE CULTURE, OB REFLEX

## 2023-05-12 LAB — CULTURE, OB URINE

## 2023-05-15 LAB — CYTOLOGY - PAP: Diagnosis: NEGATIVE

## 2023-05-17 LAB — PANORAMA PRENATAL TEST FULL PANEL:PANORAMA TEST PLUS 5 ADDITIONAL MICRODELETIONS: FETAL FRACTION: 10.3

## 2023-05-18 ENCOUNTER — Encounter: Payer: Self-pay | Admitting: Obstetrics and Gynecology

## 2023-05-18 ENCOUNTER — Other Ambulatory Visit: Payer: Self-pay

## 2023-05-20 LAB — HORIZON CUSTOM: REPORT SUMMARY: POSITIVE — AB

## 2023-05-24 ENCOUNTER — Inpatient Hospital Stay (HOSPITAL_COMMUNITY)
Admission: AD | Admit: 2023-05-24 | Discharge: 2023-05-24 | Disposition: A | Discharge status: Home / Self Care | Payer: Medicaid Other | Attending: Obstetrics & Gynecology | Admitting: Obstetrics & Gynecology

## 2023-05-24 ENCOUNTER — Encounter (HOSPITAL_COMMUNITY): Payer: Self-pay | Admitting: Obstetrics & Gynecology

## 2023-05-24 DIAGNOSIS — O26891 Other specified pregnancy related conditions, first trimester: Secondary | ICD-10-CM | POA: Diagnosis present

## 2023-05-24 DIAGNOSIS — R519 Headache, unspecified: Secondary | ICD-10-CM | POA: Diagnosis not present

## 2023-05-24 DIAGNOSIS — Z98891 History of uterine scar from previous surgery: Secondary | ICD-10-CM

## 2023-05-24 DIAGNOSIS — O10911 Unspecified pre-existing hypertension complicating pregnancy, first trimester: Secondary | ICD-10-CM | POA: Diagnosis not present

## 2023-05-24 DIAGNOSIS — Z3A13 13 weeks gestation of pregnancy: Secondary | ICD-10-CM | POA: Diagnosis not present

## 2023-05-24 LAB — URINALYSIS, ROUTINE W REFLEX MICROSCOPIC
Bilirubin Urine: NEGATIVE
Glucose, UA: NEGATIVE mg/dL
Hgb urine dipstick: NEGATIVE
Ketones, ur: NEGATIVE mg/dL
Leukocytes,Ua: NEGATIVE
Nitrite: NEGATIVE
Protein, ur: NEGATIVE mg/dL
Specific Gravity, Urine: 1.013 (ref 1.005–1.030)
pH: 7 (ref 5.0–8.0)

## 2023-05-24 MED ORDER — METOCLOPRAMIDE HCL 10 MG PO TABS
10.0000 mg | ORAL_TABLET | Freq: Four times a day (QID) | ORAL | 1 refills | Status: DC | PRN
Start: 2023-05-24 — End: 2023-07-04

## 2023-05-24 MED ORDER — METOCLOPRAMIDE HCL 10 MG PO TABS
10.0000 mg | ORAL_TABLET | Freq: Once | ORAL | Status: AC
  Administered 2023-05-24: 10 mg via ORAL
  Filled 2023-05-24: qty 1

## 2023-05-24 MED ORDER — MAGNESIUM OXIDE -MG SUPPLEMENT 400 (240 MG) MG PO TABS
400.0000 mg | ORAL_TABLET | Freq: Once | ORAL | Status: AC
  Administered 2023-05-24: 400 mg via ORAL
  Filled 2023-05-24 (×2): qty 1

## 2023-05-24 MED ORDER — MAGNESIUM OXIDE 400 MG PO CAPS: 1.0000 | ORAL_CAPSULE | Freq: Every day | ORAL | 3 refills | Status: DC

## 2023-05-24 MED ORDER — BUTALBITAL-APAP-CAFFEINE 50-325-40 MG PO TABS
2.0000 | ORAL_TABLET | Freq: Once | ORAL | Status: AC
  Administered 2023-05-24: 2 via ORAL
  Filled 2023-05-24: qty 2

## 2023-05-24 MED ORDER — BUTALBITAL-APAP-CAFFEINE 50-325-40 MG PO TABS
1.0000 | ORAL_TABLET | Freq: Four times a day (QID) | ORAL | 1 refills | Status: DC | PRN
Start: 2023-05-24 — End: 2023-07-04

## 2023-05-24 NOTE — MAU Note (Signed)
.  Deborah Wells is a 27 y.o. at [redacted]w[redacted]d here in MAU reporting: She reports she woke up with a HA again this morning. She reports she took two Excedrin Tension at 0800 with no relief. Has not eaten anything today. Reports she did not have HA's prior to pregnancy.  Onset of complaint: Ongoing throughout pregnancy Pain score: 10/10 HA - anterior headband region  FHT: 164 doppler Lab orders placed from triage:  UA

## 2023-05-24 NOTE — MAU Provider Note (Signed)
Chief Complaint: Headache   Event Date/Time   First Provider Initiated Contact with Patient 05/24/23 1254     SUBJECTIVE HPI: Deborah Wells is a 27 y.o. G2P1001 at [redacted]w[redacted]d who presents to Maternity Admissions reporting ongoing since beginning of pregnancy.  Was taken Excedrin tension headache medicine without relief.  Provider at new OB visit recommended magnesium, but patient has not tried this.  No similar headaches prior to pregnancy.  Patient headaches are associated with nausea and occasionally vomiting about 2 times per day.  Denies fever, chills, vision changes, difficulties with speech or gait.  Headache is primarily frontal.   Past Medical History:  Diagnosis Date   Gonorrhea 01/24/2020   To get rocephin 7/9, POC________   Hypertension    pregnancy hypertension   Vaginal Pap smear, abnormal    OB History  Gravida Para Term Preterm AB Living  2 1 1  0 0 1  SAB IAB Ectopic Multiple Live Births  0 0 0 0 1    # Outcome Date GA Lbr Len/2nd Weight Sex Type Anes PTL Lv  2 Current           1 Term 06/21/16 [redacted]w[redacted]d  2150 g F CS-LTranv Gen  LIV     Complications: Fetal Intolerance    Obstetric Comments  Gestational Hypertension w/first   Past Surgical History:  Procedure Laterality Date   CESAREAN SECTION N/A 06/21/2016   Procedure: CESAREAN SECTION;  Surgeon: Tereso Newcomer, MD;  [facility]: [facility];  Service: Obstetrics;  Laterality: N/A;   INSERTION OF NON VAGINAL CONTRACEPTIVE DEVICE  06/24/2016       Social History   Socioeconomic History   Marital status: Single    Spouse name: Not on file   Number of children: Not on file   Years of education: Not on file   Highest education level: Not on file  Occupational History   Not on file  Tobacco Use   Smoking status: Never   Smokeless tobacco: Never  Vaping Use   Vaping status: Never Used  Substance and Sexual Activity   Alcohol use: No   Drug use: Never   Sexual activity: Yes    Birth  control/protection: None  Other Topics Concern   Not on file  Social History Narrative   ** Merged History Encounter **       Social Determinants of Health   Financial Resource Strain: Low Risk  (01/22/2020)   Overall Financial Resource Strain (CARDIA)    Difficulty of Paying Living Expenses: Not very hard  Food Insecurity: Food Insecurity Present (01/22/2020)   Hunger Vital Sign    Worried About Running Out of Food in the Last Year: Sometimes true    Ran Out of Food in the Last Year: Sometimes true  Transportation Needs: Unmet Transportation Needs (01/22/2020)   PRAPARE - Transportation    Lack of Transportation (Medical): Yes    Lack of Transportation (Non-Medical): Yes  Physical Activity: Insufficiently Active (01/22/2020)   Exercise Vital Sign    Days of Exercise per Week: 2 days    Minutes of Exercise per Session: 20 min  Stress: Not on file  Social Connections: Socially Isolated (01/22/2020)   Social Connection and Isolation Panel [NHANES]    Frequency of Communication with Friends and Family: Once a week    Frequency of Social Gatherings with Friends and Family: Never    Attends Religious Services: More than 4 times per year    Active Member of [employer] or Organizations:  No    Attends Banker Meetings: Never    Marital Status: Never married  Intimate Partner Violence: Not At Risk (01/22/2020)   Humiliation, Afraid, Rape, and Kick questionnaire    Fear of Current or Ex-Partner: No    Emotionally Abused: No    Physically Abused: No    Sexually Abused: No   Family History  Problem Relation Age of Onset   Diabetes Mother    Breast cancer Maternal Grandmother    No current facility-administered medications on file prior to encounter.   Current Outpatient Medications on File Prior to Encounter  Medication Sig Dispense Refill   acetaminophen-caffeine (EXCEDRIN TENSION HEADACHE) 500-65 MG TABS per tablet Take 2 tablets by mouth every 6 (six) hours as needed. 60 tablet 0    aspirin EC 81 MG tablet Take 1 tablet (81 mg total) by mouth daily. Start taking when you are [redacted] weeks pregnant for rest of pregnancy for prevention of preeclampsia 300 tablet 2   Blood Pressure Monitoring (BLOOD PRESSURE KIT) DEVI 1 Device by Does not apply route once a week. 1 each 0   Doxylamine-Pyridoxine 10-10 MG TBEC Take 2 tablets by mouth at bedtime. 60 tablet 0   Prenatal MV & Min w/FA-DHA (PRENATAL ADULT GUMMY/DHA/FA) 0.4-25 MG CHEW Take one pill daily 90 tablet 3   Prenatal Vit-Fe Fumarate-FA (PREPLUS) 27-1 MG TABS Take 1 tablet by mouth daily. (Patient not taking: Reported on 05/10/2023) 30 tablet 13   prochlorperazine (COMPAZINE) 5 MG tablet Take 1 tablet (5 mg total) by mouth every 6 (six) hours as needed for nausea or vomiting. 30 tablet 0   scopolamine (TRANSDERM-SCOP) 1 MG/3DAYS Place 1 patch (1.5 mg total) onto the skin every 3 (three) days. 10 patch 0   No Known Allergies  I have reviewed patient's Past Medical Hx, Surgical Hx, Family Hx, Social Hx, medications and allergies.   Review of Systems per HPI.  OBJECTIVE Patient Vitals for the past 24 hrs:  BP Temp Temp src Pulse Resp SpO2 Height Weight  05/24/23 1222 134/81 97.9 F (36.6 C) Oral 94 16 100 % 5\' 2"  (1.575 m) 69.9 kg   Constitutional: Well-developed, well-nourished female in no acute distress.  Cardiovascular: normal rate Respiratory: normal rate and effort.  GI: Deferred MS: Deferred Neurologic: Alert and oriented x 4.  Normal speech and gait.  Symmetric facial movements. GU: Deferred.    Fetal heart rate 164 by Doppler.  LAB RESULTS Results for orders placed or performed during the hospital encounter of 05/24/23 (from the past 24 hour(s))  Urinalysis, Routine w reflex microscopic -Urine, Clean Catch     Status: Abnormal   Collection Time: 05/24/23 12:30 PM  Result Value Ref Range   Color, Urine YELLOW YELLOW   APPearance HAZY (A) CLEAR   Specific Gravity, Urine 1.013 1.005 - 1.030   pH 7.0 5.0 -  8.0   Glucose, UA NEGATIVE NEGATIVE mg/dL   Hgb urine dipstick NEGATIVE NEGATIVE   Bilirubin Urine NEGATIVE NEGATIVE   Ketones, ur NEGATIVE NEGATIVE mg/dL   Protein, ur NEGATIVE NEGATIVE mg/dL   Nitrite NEGATIVE NEGATIVE   Leukocytes,Ua NEGATIVE NEGATIVE    IMAGING No results found.  MAU COURSE Orders Placed This Encounter  Procedures   Urinalysis, Routine w reflex microscopic -Urine, Clean Catch   Encourage fluids   Meds ordered this encounter  Medications   butalbital-acetaminophen-caffeine (FIORICET) 50-325-40 MG per tablet 2 tablet   metoCLOPramide (REGLAN) tablet 10 mg   magnesium oxide (MAG-OX) tablet 400  mg   Headache 4/10 after medications and p.o. hydration.  MDM -13 weeks 2 days gestation with new recurrent headaches since beginning of pregnancy.  No prior history of headaches.  Headache improved significantly with Fioricet, magnesium and Reglan.  Nausea and vomiting also resolved.  No headache red flags.  No clear etiology for headache.  Patient not dehydrated.  Will DC home on same medications.  Will refer to Nada Maclachlan PA for further headache management.  ASSESSMENT 1. Pregnancy headache in first trimester   2. [redacted] weeks gestation of pregnancy     PLAN Discharge home in stable condition. Headache precautions   Follow-up Information     [facility] for [facility] Follow up.   Specialty: Obstetrics and Gynecology Why: Routine prenatal visit or sooner as needed if symptoms worsen Contact information: [address], [address] [place] [address] [phone]        [facility] for [employer] [facility] [place]. Call.   Specialty: Obstetrics and Gynecology Why: Nada Maclachlan, PA for headache management Contact information: [address] [place] [address] [phone]        [facility] Follow up.   Specialty: Obstetrics and  Gynecology Why: As needed in emergencies Contact information: [address] [place] [address] [phone]                Allergies as of 05/24/2023   No Known Allergies      Medication List     STOP taking these medications    Excedrin Tension Headache 500-65 MG Tabs per tablet Generic drug: acetaminophen-caffeine   PrePLUS 27-1 MG Tabs   prochlorperazine 5 MG tablet Commonly known as: COMPAZINE       TAKE these medications    aspirin EC 81 MG tablet Take 1 tablet (81 mg total) by mouth daily. Start taking when you are [redacted] weeks pregnant for rest of pregnancy for prevention of preeclampsia   Blood Pressure Kit Devi 1 Device by Does not apply route once a week.   butalbital-acetaminophen-caffeine 50-325-40 MG tablet Commonly known as: FIORICET Take 1-2 tablets by mouth every 6 (six) hours as needed for headache.   Doxylamine-Pyridoxine 10-10 MG Tbec Take 2 tablets by mouth at bedtime.   Magnesium Oxide 400 MG Caps Take 1 capsule (400 mg total) by mouth daily.   metoCLOPramide 10 MG tablet Commonly known as: REGLAN Take 1 tablet (10 mg total) by mouth every 6 (six) hours as needed for nausea.   Prenatal Adult Gummy/DHA/FA 0.4-25 MG Chew Take one pill daily   scopolamine 1 MG/3DAYS Commonly known as: TRANSDERM-SCOP Place 1 patch (1.5 mg total) onto the skin every 3 (three) days.         Katrinka Blazing, [place], CNM 05/24/2023  2:45 PM

## 2023-05-29 ENCOUNTER — Telehealth: Payer: Self-pay | Admitting: Emergency Medicine

## 2023-05-29 NOTE — Telephone Encounter (Signed)
Pt informed of results, would like to do partner screening at next visit.

## 2023-05-29 NOTE — Telephone Encounter (Signed)
-----   Message from [facility] sent at 05/22/2023  1:11 PM EST ----- Can we notify of alpha thal carrier and offer partner testing at next visit

## 2023-06-07 ENCOUNTER — Encounter: Payer: Medicaid Other | Admitting: Obstetrics and Gynecology

## 2023-07-04 ENCOUNTER — Ambulatory Visit: Payer: Medicaid Other | Attending: Obstetrics and Gynecology

## 2023-07-04 ENCOUNTER — Other Ambulatory Visit: Payer: Self-pay | Admitting: *Deleted

## 2023-07-04 ENCOUNTER — Ambulatory Visit: Payer: Medicaid Other

## 2023-07-04 ENCOUNTER — Other Ambulatory Visit: Payer: Self-pay

## 2023-07-04 ENCOUNTER — Ambulatory Visit: Payer: Medicaid Other | Attending: Obstetrics and Gynecology | Admitting: Obstetrics and Gynecology

## 2023-07-04 VITALS — BP 128/86 | HR 97

## 2023-07-04 DIAGNOSIS — O289 Unspecified abnormal findings on antenatal screening of mother: Secondary | ICD-10-CM

## 2023-07-04 DIAGNOSIS — O283 Abnormal ultrasonic finding on antenatal screening of mother: Secondary | ICD-10-CM

## 2023-07-04 DIAGNOSIS — O34219 Maternal care for unspecified type scar from previous cesarean delivery: Secondary | ICD-10-CM | POA: Diagnosis not present

## 2023-07-04 DIAGNOSIS — O09292 Supervision of pregnancy with other poor reproductive or obstetric history, second trimester: Secondary | ICD-10-CM | POA: Diagnosis not present

## 2023-07-04 DIAGNOSIS — Z3A19 19 weeks gestation of pregnancy: Secondary | ICD-10-CM

## 2023-07-04 DIAGNOSIS — O99012 Anemia complicating pregnancy, second trimester: Secondary | ICD-10-CM

## 2023-07-04 DIAGNOSIS — O0990 Supervision of high risk pregnancy, unspecified, unspecified trimester: Secondary | ICD-10-CM | POA: Diagnosis present

## 2023-07-04 DIAGNOSIS — O35AXX0 Maternal care for other (suspected) fetal abnormality and damage, fetal facial anomalies, not applicable or unspecified: Secondary | ICD-10-CM | POA: Diagnosis not present

## 2023-07-04 DIAGNOSIS — Z8759 Personal history of other complications of pregnancy, childbirth and the puerperium: Secondary | ICD-10-CM

## 2023-07-04 DIAGNOSIS — D563 Thalassemia minor: Secondary | ICD-10-CM

## 2023-07-04 NOTE — Progress Notes (Addendum)
G2 P0101 at 19w 1d gestation. Patient is here for fetal anatomy scan. On cell-free fetal DNA screening, the risks of aneuploidies are not increased. She is a silent carrier for alpha thalassemia.  Obstetric history is significant for a preterm (36w 5d) cesarean delivery of a female infant weighing 4-11 at birth. Her pregnancy was complicated by gestational hypertension. Cesarean delivery was performed because of non-reassuring fetal heart trace. Her daughter is in good health.  Patient takes low-dose aspirin prophylaxis.  Blood pressure today at our office is 128/86 mm Hg.  We performed a fetal anatomy scan. 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 No other markers of aneuploidies or fetal structural defects are seen. Fetal biometry lags GA by 6 days. Amniotic fluid is normal and good fetal activity is seen. Patient understands the limitations of ultrasound in detecting fetal anomalies.  As maternal obesity imposes limitations on the resolution of images, fetal anomalies may be missed.  Absent nasal bone I counseled the patient that absent nasal bone is a soft marker for Down syndrome. It is also more-commonly seen (9%) in Afro-Caribbean population.  Nasal bone is absent in 0.1% to 0.2% of euploid pregnancies.  I discussed the significance and limitations of cell-free fetal DNA screening and informed her that it detects Down syndrome in 99 out of 100 cases. However, only amniocentesis will give a definitive result on the fetal karyotype.  I reassured the couple that her risk for fetal Down syndrome is very low on cell-free fetal DNA screening.   Thank you for consultation.  If you have any questions or concerns, please contact me the [facility].  Consultation including face-to-face (more than 50%) counseling 20 minutes.

## 2023-07-05 ENCOUNTER — Encounter: Payer: Self-pay | Admitting: Obstetrics and Gynecology

## 2023-07-05 ENCOUNTER — Ambulatory Visit (INDEPENDENT_AMBULATORY_CARE_PROVIDER_SITE_OTHER): Payer: Medicaid Other | Admitting: Obstetrics and Gynecology

## 2023-07-05 VITALS — BP 120/78 | HR 102 | Wt 166.4 lb

## 2023-07-05 DIAGNOSIS — Z98891 History of uterine scar from previous surgery: Secondary | ICD-10-CM

## 2023-07-05 DIAGNOSIS — Z3A19 19 weeks gestation of pregnancy: Secondary | ICD-10-CM

## 2023-07-05 DIAGNOSIS — O0990 Supervision of high risk pregnancy, unspecified, unspecified trimester: Secondary | ICD-10-CM

## 2023-07-05 DIAGNOSIS — Z8759 Personal history of other complications of pregnancy, childbirth and the puerperium: Secondary | ICD-10-CM

## 2023-07-05 NOTE — Progress Notes (Signed)
   PRENATAL VISIT NOTE  Subjective:  Deborah Wells is a 27 y.o. G2P1001 at [redacted]w[redacted]d being seen today for ongoing prenatal care.  She is currently monitored for the following issues for this low-risk pregnancy and has S/P emergency cesarean section for fetal bradycardia; History of gestational hypertension; History of cesarean section; Frequent headaches; and Supervision of high risk pregnancy, antepartum on their problem list.  Patient reports no complaints.  Contractions: Not present.  .  Movement: Present. Denies leaking of fluid.   The following portions of the patient's history were reviewed and updated as appropriate: allergies, current medications, past family history, past medical history, past social history, past surgical history and problem list.   Objective:   Vitals:   07/05/23 1340  BP: 120/78  Pulse: (!) 102  Weight: 166 lb 6.4 oz (75.5 kg)    Fetal Status: Fetal Heart Rate (bpm): 153   Movement: Present     General:  Alert, oriented and cooperative. Patient is in no acute distress.  Skin: Skin is warm and dry. No rash noted.   Cardiovascular: Normal heart rate noted  Respiratory: Normal respiratory effort, no problems with respiration noted  Abdomen: Soft, gravid, appropriate for gestational age.  Pain/Pressure: Absent     Pelvic: Cervical exam deferred        Extremities: Normal range of motion.  Edema: None  Mental Status: Normal mood and affect. Normal behavior. Normal judgment and thought content.   Assessment and Plan:  Pregnancy: G2P1001 at [redacted]w[redacted]d 1. Supervision of high risk pregnancy, antepartum (Primary) Patient is doing well without complaints Patient declined AFP Follow up anatomy 08/08/23  2. [redacted] weeks gestation of pregnancy   3. History of cesarean section Patient undecided on TOLAC vs RCS- information provided  4. History of gestational hypertension Normotensive  Preterm labor symptoms and general obstetric precautions including but not limited  to vaginal bleeding, contractions, leaking of fluid and fetal movement were reviewed in detail with the patient. Please refer to After Visit Summary for other counseling recommendations.   Return in about 4 weeks (around 08/02/2023) for in person, ROB, High risk.  Future Appointments  Date Time Provider Department Center  08/08/2023  9:15 AM [facility] NURSE [facility]  08/08/2023  9:30 AM [facility] US3 [facility]    Catalina Antigua, MD

## 2023-07-05 NOTE — Progress Notes (Signed)
Pt presents for ROB. 

## 2023-07-19 NOTE — L&D Delivery Note (Addendum)
 OB/GYN Faculty Practice Delivery Note  Deborah Wells is a 28 y.o. G2P1001 s/p VBAC at [redacted]w[redacted]d. She was admitted for IOL in setting of pre-eclampsia w severe features.   ROM: 12h 29m with clear, bloody fluid GBS Status: Negative/-- (04/07 0848) Maximum Maternal Temperature: 99.28F   Labor Progress: Initial SVE: 1/50/-3. She then progressed to complete.   Delivery Date/Time: 10/31/23 1228 Delivery: Called to room and patient was complete and pushing. Head delivered LOP. No nuchal cord present. Shoulder and body delivered in usual fashion. Infant with spontaneous cry, placed on mother's abdomen, dried and stimulated. Cord clamped x 2 after 1-minute delay, and cut by father of baby. Cord blood drawn. Placenta delivered spontaneously with gentle cord traction. Fundus firm with massage and Pitocin and TXA. Labia, perineum, vagina, and cervix inspected inspected with a hemostatic right periurethral abrasion noted which did not require repair.  Baby Weight: pending  Placenta: Sent to L&D Complications: None Lacerations: Right hemostatic periurethral abrasion EBL: 100 mL Analgesia: Epidural   Infant:  APGAR (1 MIN): 7  APGAR (5 MINS): 9  Melanie Spires, MD OB Fellow, Faculty Practice [facility], [facility] for [facility]

## 2023-08-02 ENCOUNTER — Telehealth: Payer: Medicaid Other | Admitting: Obstetrics and Gynecology

## 2023-08-02 DIAGNOSIS — O0992 Supervision of high risk pregnancy, unspecified, second trimester: Secondary | ICD-10-CM

## 2023-08-02 DIAGNOSIS — Z3A23 23 weeks gestation of pregnancy: Secondary | ICD-10-CM

## 2023-08-02 DIAGNOSIS — Z98891 History of uterine scar from previous surgery: Secondary | ICD-10-CM

## 2023-08-02 DIAGNOSIS — O0990 Supervision of high risk pregnancy, unspecified, unspecified trimester: Secondary | ICD-10-CM

## 2023-08-02 DIAGNOSIS — Z8759 Personal history of other complications of pregnancy, childbirth and the puerperium: Secondary | ICD-10-CM

## 2023-08-02 MED ORDER — BLOOD PRESSURE KIT DEVI: 1.0000 | 0 refills | Status: AC

## 2023-08-02 NOTE — Progress Notes (Signed)
 OBSTETRICS PRENATAL VIRTUAL VISIT ENCOUNTER NOTE  Provider location: [facility]   Patient location: Home  I connected with Deborah Wells on 08/02/23 at 10:35 AM EST by MyChart Video Encounter and verified that I am speaking with the correct person using two identifiers. I discussed the limitations, risks, security and privacy concerns of performing an evaluation and management service virtually and the availability of in person appointments. I also discussed with the patient that there may be a patient responsible charge related to this service. The patient expressed understanding and agreed to proceed. Subjective:  Deborah Wells is a 28 y.o. G2P1001 at [redacted]w[redacted]d being seen today for ongoing prenatal care.  She is currently monitored for the following issues for this high-risk pregnancy and has S/P emergency cesarean section for fetal bradycardia; History of gestational hypertension; History of cesarean section; Frequent headaches; and Supervision of high risk pregnancy, antepartum on their problem list.  Patient reports no complaints.  Contractions: Not present. Vag. Bleeding: None.  Movement: Present. Denies any leaking of fluid.   The following portions of the patient's history were reviewed and updated as appropriate: allergies, current medications, past family history, past medical history, past social history, past surgical history and problem list.   Objective:  There were no vitals filed for this visit.  Fetal Status:     Movement: Present     General:  Alert, oriented and cooperative. Patient is in no acute distress.  Respiratory: Normal respiratory effort, no problems with respiration noted  Mental Status: Normal mood and affect. Normal behavior. Normal judgment and thought content.  Rest of physical exam deferred due to type of encounter  Imaging: US  MFM OB DETAIL +14 WK Result Date:  07/04/2023 ----------------------------------------------------------------------  OBSTETRICS REPORT                       (Signed Final 07/04/2023 01:08 pm) ---------------------------------------------------------------------- Patient Info  ID #:       [id number]                          D.O.B.:  08/27/1995 (27 yrs)(F)  Name:       Deborah Wells              Visit Date: 07/04/2023 09:10 am ---------------------------------------------------------------------- Performed By  Attending:        Cassandria Clever MD        Ref. Address:     [address]                                                             [phone]  [place]                                                             [address]  Performed By:     Levonne Rear        [facility]  Referred By:      [facility] ---------------------------------------------------------------------- Orders  #  Description                           Code        Ordered By  1  US  MFM OB DETAIL +14 WK               76811.01    [facility] ----------------------------------------------------------------------  #  Order #                     Accession #                Episode #  1  [id number]                   [id number]                 [id number] ---------------------------------------------------------------------- Indications  Poor obstetric history: Previous gestational   O09.299  HTN  Previous cesarean delivery, antepartum         O34.219  Genetic carrier (Silent carrier alpha thal     Z14.8  LR female  [redacted] weeks gestation of pregnancy                Z3A.19  Encounter for antenatal screening for           Z36.3  malformations  Abnormal fetal ultrasound (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  Cord Insertion:      Visualized, central  Amniotic Fluid  AFI FV:      Within normal limits                              Largest Pocket(cm)                              4.62 ---------------------------------------------------------------------- Biometry  BPD:      41.4  mm     G. Age:  18w 4d         25  %    CI:        71.19   %    70 - 86                                                          FL/HC:      16.4   %    16.1 - 18.3  HC:      156.3  mm     G. Age:  18w 4d         17  %    HC/AC:      1.24        1.09 - 1.39  AC:      125.9  mm     G. Age:  18w 1d         17  %    FL/BPD:     62.1   %  FL:       25.7  mm     G. Age:  17w 6d          7  %    FL/AC:      20.4   %    20 - 24  HUM:        25  mm     G. Age:  17w 6d         13  %  CER:      18.8  mm     G. Age:  18w 3d         14  %  NFT:       2.4  mm  LV:          6  mm  CM:          5  mm  Est. FW:     223  gm      0 lb 8 oz      5  % ---------------------------------------------------------------------- OB History  Blood Type:   O+  Gravidity:    2         Term:   1  Living:       1 ---------------------------------------------------------------------- Gestational Age  LMP:           19w 1d        Date:  02/20/23                   EDD:   11/27/23  U/S Today:     18w 2d  EDD:   12/03/23  Best:          19w 1d     Det. By:  LMP  (02/20/23)          EDD:   11/27/23 ---------------------------------------------------------------------- Targeted Anatomy  Central Nervous System  Calvarium/Cranial V.:  Appears normal         Cereb./Vermis:          Appears normal  Cavum:                 Appears  normal         Cisterna Magna:         Appears normal  Lateral Ventricles:    Appears normal         Midline Falx:           Appears normal  Choroid Plexus:        Appears normal  Spine  Cervical:              Appears normal         Sacral:                 Appears normal  Thoracic:              Appears normal         Shape/Curvature:        Appears normal  Lumbar:                Appears normal  Head/Neck  Lips:                  Appears normal         Profile:                Appears normal  Neck:                  Appears normal         Orbits/Eyes:            Appears normal  Nuchal Fold:           Appears normal         Mandible:               Not well visualized  Nasal Bone:            Absent                 Maxilla:                Not well visualized  Thorax  4 Chamber View:        Not well visualized    Interventr. Septum:     Not well visualized  Cardiac Rhythm:        Normal                 Cardiac Axis:           Normal  Cardiac Situs:         Appears normal         Diaphragm:              Appears normal  Rt Outflow Tract:      Appears normal         3 Vessel View:          Appears normal  Lt Outflow Tract:      Appears normal         3 V Trachea View:  Appears normal  Aortic Arch:           Appears normal         IVC:                    Appears normal  Ductal Arch:           Appears normal         Crossing:               Appears normal  SVC:                   Appears normal  Abdomen  Ventral Wall:          Appears normal         Lt Kidney:              Appears normal  Cord Insertion:        Appears normal         Rt Kidney:              Appears normal  Situs:                 Appears normal         Bladder:                Appears normal  Stomach:               Appears normal  Extremities  Lt Humerus:            Appears normal         Lt Femur:               Appears normal  Rt Humerus:            Appears normal         Rt Femur:               Appears normal  Lt Forearm:            Appears normal         Lt  Lower Leg:           Appears normal  Rt Forearm:            Appears normal         Rt Lower Leg:           Appears normal  Lt Hand:               Visualized             Lt Foot:                Nml heel/foot  Rt Hand:               Visualized             Rt Foot:                Nml heel/foot  Other  Umbilical Cord:        Normal 3-vessel        Genitalia:              Female-nml  Comment:     Technically difficult due to fetal position. ---------------------------------------------------------------------- Cervix Uterus Adnexa  Cervix  Length:            3.2  cm.  Normal appearance by transabdominal scan  Uterus  No abnormality visualized.  Right Ovary  Size(cm)     3.43   x   2.39   x  1.94      Vol(ml): 8.33  Within normal limits.  Left Ovary  Size(cm)     2.82   x   2.19   x  1.88      Vol(ml): 6.08  Within normal limits.  Cul De Sac  No free fluid seen.  Adnexa  No abnormality visualized ---------------------------------------------------------------------- Impression  G2 P0101 at 19w 1d gestation. Patient is here for fetal  anatomy scan. On cell-free fetal DNA screening, the risks of  aneuploidies are not increased. She is a silent carrier for  alpha thalassemia.  Obstetric history is significant for a preterm (36w 5d)  cesarean delivery of a female infant weighing 4-11 at birth.  Her pregnancy was complicated by gestational hypertension.  Cesarean delivery was performed because of non-reassuring  fetal heart trace. Her daughter is in good health.  Patient takes low-dose aspirin  prophylaxis.  Blood pressure today at our office is 128/86 mm Hg.  We performed a fetal anatomy scan. 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  No other markers of aneuploidies or fetal structural defects  are seen. Fetal biometry lags GA by 6 days. Amniotic fluid is  normal and good fetal activity is seen. Patient understands  the limitations of ultrasound in detecting fetal anomalies.As  maternal obesity imposes limitations on the resolution of  images,  fetal anomalies may be missed.  Absent nasal bone  I counseled the patient that absent nasal bone is a soft  marker for Down syndrome. It is also more-commonly seen  (9%) in Afro-[place]bean population.  Nasal bone is absent in  0.1% to 0.2% of euploid pregnancies.  I discussed the significance and limitations of cell-free fetal  DNA screening and informed her that it detects Down  syndrome in 99 out of 100 cases. However, only  amniocentesis will give a definitive result on the fetal  karyotype.  I reassured the couple that her risk for fetal Down syndrome  is very low on cell-free fetal DNA screening. ---------------------------------------------------------------------- Recommendations  -An appointment was made for her to return in 5 weeks for  completion of fetal anatomy. ----------------------------------------------------------------------                  Cassandria Clever, MD Electronically Signed Final Report   07/04/2023 01:08 pm ----------------------------------------------------------------------    Assessment and Plan:  Pregnancy: G2P1001 at [redacted]w[redacted]d 1. [redacted] weeks gestation of pregnancy (Primary)   2. Supervision of high risk pregnancy, antepartum Continue routine prenatal care Pt does desire TOLAC, will need consent signed and formal discussion.  3. History of gestational hypertension Pt advised to get BP check in 1 week  Preterm labor symptoms and general obstetric precautions including but not limited to vaginal bleeding, contractions, leaking of fluid and fetal movement were reviewed in detail with the patient. I discussed the assessment and treatment plan with the patient. The patient was provided an opportunity to ask questions and all were answered. The patient agreed with the plan and demonstrated an understanding of the instructions. The patient was advised to call back or seek an in-person office evaluation/go to MAU at [facility] for any urgent or concerning  symptoms. Please refer to After Visit Summary for other counseling recommendations.   I provided 10 minutes of face-to-face time during this encounter.  Return in about 4 weeks (around 08/30/2023) for [facility], in person, 2 hr GTT, 3rd trim labs.  Future Appointments  Date  Time Provider Department Center  08/08/2023  9:15 AM [facility] NURSE [facility]  08/08/2023  9:30 AM [facility] US3 [facility]    Abigail Abler, MD [facility] [employer], [facility]

## 2023-08-02 NOTE — Progress Notes (Signed)
 S/w pt for virtual visit. Pt reports fetal movement, denies any pain. Pt states that she does not have a BP cuff Sent BP cuff to [facility].

## 2023-08-08 ENCOUNTER — Other Ambulatory Visit: Payer: Self-pay

## 2023-08-08 ENCOUNTER — Ambulatory Visit: Payer: Medicaid Other | Admitting: *Deleted

## 2023-08-08 ENCOUNTER — Ambulatory Visit: Payer: Medicaid Other | Attending: Obstetrics and Gynecology

## 2023-08-08 VITALS — BP 135/72 | HR 104

## 2023-08-08 DIAGNOSIS — Z3A24 24 weeks gestation of pregnancy: Secondary | ICD-10-CM

## 2023-08-08 DIAGNOSIS — O0990 Supervision of high risk pregnancy, unspecified, unspecified trimester: Secondary | ICD-10-CM | POA: Diagnosis present

## 2023-08-08 DIAGNOSIS — O34219 Maternal care for unspecified type scar from previous cesarean delivery: Secondary | ICD-10-CM | POA: Diagnosis not present

## 2023-08-08 DIAGNOSIS — O09292 Supervision of pregnancy with other poor reproductive or obstetric history, second trimester: Secondary | ICD-10-CM

## 2023-08-08 DIAGNOSIS — O99012 Anemia complicating pregnancy, second trimester: Secondary | ICD-10-CM

## 2023-08-08 DIAGNOSIS — O35AXX0 Maternal care for other (suspected) fetal abnormality and damage, fetal facial anomalies, not applicable or unspecified: Secondary | ICD-10-CM

## 2023-08-08 DIAGNOSIS — Z8759 Personal history of other complications of pregnancy, childbirth and the puerperium: Secondary | ICD-10-CM | POA: Diagnosis present

## 2023-08-08 DIAGNOSIS — D563 Thalassemia minor: Secondary | ICD-10-CM

## 2023-09-03 ENCOUNTER — Encounter (HOSPITAL_COMMUNITY): Payer: Self-pay | Admitting: Obstetrics & Gynecology

## 2023-09-03 ENCOUNTER — Inpatient Hospital Stay (HOSPITAL_COMMUNITY)
Admission: AD | Admit: 2023-09-03 | Discharge: 2023-09-03 | Disposition: A | Discharge status: Home / Self Care | Payer: Medicaid Other | Attending: Obstetrics & Gynecology | Admitting: Obstetrics & Gynecology

## 2023-09-03 ENCOUNTER — Other Ambulatory Visit: Payer: Self-pay

## 2023-09-03 DIAGNOSIS — O26892 Other specified pregnancy related conditions, second trimester: Secondary | ICD-10-CM | POA: Insufficient documentation

## 2023-09-03 DIAGNOSIS — N898 Other specified noninflammatory disorders of vagina: Secondary | ICD-10-CM

## 2023-09-03 DIAGNOSIS — O26891 Other specified pregnancy related conditions, first trimester: Secondary | ICD-10-CM | POA: Diagnosis present

## 2023-09-03 DIAGNOSIS — N949 Unspecified condition associated with female genital organs and menstrual cycle: Secondary | ICD-10-CM

## 2023-09-03 DIAGNOSIS — Z3A27 27 weeks gestation of pregnancy: Secondary | ICD-10-CM | POA: Insufficient documentation

## 2023-09-03 LAB — URINALYSIS, ROUTINE W REFLEX MICROSCOPIC
Bilirubin Urine: NEGATIVE
Glucose, UA: NEGATIVE mg/dL
Hgb urine dipstick: NEGATIVE
Ketones, ur: NEGATIVE mg/dL
Leukocytes,Ua: NEGATIVE
Nitrite: NEGATIVE
Protein, ur: NEGATIVE mg/dL
Specific Gravity, Urine: 1.015 (ref 1.005–1.030)
pH: 7 (ref 5.0–8.0)

## 2023-09-03 LAB — WET PREP, GENITAL
Clue Cells Wet Prep HPF POC: NONE SEEN
Sperm: NONE SEEN
Trich, Wet Prep: NONE SEEN
WBC, Wet Prep HPF POC: >=10 — AB (ref <10)
Yeast Wet Prep HPF POC: NONE SEEN

## 2023-09-03 MED ORDER — TERCONAZOLE 0.4 % VA CREA
1.0000 | TOPICAL_CREAM | Freq: Every day | VAGINAL | 0 refills | Status: DC
Start: 2023-09-03 — End: 2023-10-24

## 2023-09-03 MED ORDER — CYCLOBENZAPRINE HCL 10 MG PO TABS: 10.0000 mg | ORAL_TABLET | Freq: Every day | ORAL | 0 refills | Status: DC

## 2023-09-03 NOTE — MAU Note (Signed)
 Deborah Wells is a 28 y.o. at [redacted]w[redacted]d here in MAU reporting: lower abdominal pain that started 1 week ago that is constant and becomes worse with movement. States that pain lightens once she sits down. Pt reports changing her soap 3 days ago and is reporting vaginal itching. Denies any changes in vaginal discharge or odor. Denies any recent sexual intercourse. Denies any urinary symptoms. Denies any VB or LOF. Reports +FM.   Onset of complaint: ongoing Pain score: 4 w/ no activity  8 w/ activity  Vitals:   09/03/23 1645 09/03/23 1653  BP:  124/65  Pulse:  (!) 113  Resp:    Temp:    SpO2: 97%      FHT:160 Lab orders placed from triage:  UA, vaginal swabs

## 2023-09-03 NOTE — Discharge Instructions (Signed)
Alternative Vaginitis Therapies  1) soak in tub of warm water waist high with 1/2 cup of baking soda in water for ~ 20 mins.  OR 2) 1 tablespoon of fractionated (liquid form) coconut oil with 10 drops of Melaleuca (Tea Tree) essential oil, mix well and wipe 1 saturated cotton ball externally at bedtime x 3 days.   Both options are to be done after sexual intercourse, menses and/or when suspects Bacterial Vaginosis and/or yeast infection. Please be advised that these alternatives will not replace the need to be evaluated, if symptoms persist. You will need to seek care at an OB/GYN provider.  GO WHITE: Soap: UNSCENTED Dove (white box light green writing) Laundry detergent (underwear)- Dreft or [employer] unscented WHITE 100% cotton panties (NOT just cotton crouch) Sanitary napkin/panty liners: UNSCENTED.  If it doesn't SAY unscented it can have a scent/perfume    NO PERFUMES OR LOTIONS OR POTIONS in the vulvar (lips) area (may use water-based or silicone-based lubricant) Condoms: hypoallergenic only. Non dyed (no color) Toilet papers: white only Wash clothes: use a separate wash cloth. WHITE.  Wash in [place].   You can purchase fractionated (liquid form) coconut oil and Tea Tree (Melaluca) Oil locally at:  [employer] [address] [place], [place] [address] [phone]  [facility] [address] [place], [place] [address] [phone]

## 2023-09-04 LAB — GC/CHLAMYDIA PROBE AMP (~~LOC~~) NOT AT ARMC
Chlamydia: NEGATIVE
Comment: NEGATIVE
Comment: NORMAL
Neisseria Gonorrhea: NEGATIVE

## 2023-09-06 ENCOUNTER — Encounter: Payer: Self-pay | Admitting: Family Medicine

## 2023-09-06 ENCOUNTER — Other Ambulatory Visit: Payer: Medicaid Other

## 2023-09-06 DIAGNOSIS — Z3A28 28 weeks gestation of pregnancy: Secondary | ICD-10-CM

## 2023-09-06 DIAGNOSIS — O0990 Supervision of high risk pregnancy, unspecified, unspecified trimester: Secondary | ICD-10-CM

## 2023-09-08 NOTE — Progress Notes (Signed)
 Chart opened in error  Raelyn Mora, CNM

## 2023-09-08 NOTE — MAU Provider Note (Signed)
 History     CSN: [id number]  Arrival date and time: 09/03/23 1625   Event Date/Time   First Provider Initiated Contact with Patient 09/03/23 1726      Chief Complaint  Patient presents with   Abdominal Pain   HPI [name] is a 28 y.o. year old G44P1001 female at [redacted]w[redacted]d weeks gestation who presents to MAU reporting lower abdominal pain for 1 week. She describes the pain as constant and increases with movement. She reports the pain "lightens up" when she is sitting. She rates the pain as 4/10 while sitting and 8/10. She also complains of vaginal itching for 3 days. She reports changing soaps 3 days ago also. She denies any change in vaginal d/c or any vaginal odor. She denies any recent SI. She receives [facility] with [facility]; next appt is 09/06/2023.    OB History     Gravida  2   Para  1   Term  1   Preterm  0   AB  0   Living  1      SAB  0   IAB  0   Ectopic  0   Multiple  0   Live Births  1        Obstetric Comments  Gestational Hypertension w/first         Past Medical History:  Diagnosis Date   Asthma    Frequent headaches    Gonorrhea 01/24/2020   To get rocephin 7/9, POC________   Hypertension    pregnancy hypertension   Vaginal Pap smear, abnormal     Past Surgical History:  Procedure Laterality Date   CESAREAN SECTION N/A 06/21/2016   Procedure: CESAREAN SECTION;  Surgeon: Tereso Newcomer, MD;  Location: [facility];  Service: Obstetrics;  Laterality: N/A;   INSERTION OF NON VAGINAL CONTRACEPTIVE DEVICE  06/24/2016        Family History  Problem Relation Age of Onset   Diabetes Mother    Cancer Maternal Grandmother    Breast cancer Maternal Grandmother    Asthma Neg Hx    Heart disease Neg Hx    Hypertension Neg Hx     Social History   Tobacco Use   Smoking status: Never   Smokeless tobacco: Never  Vaping Use   Vaping status: Never Used  Substance Use Topics   Alcohol use: No   Drug use: Never     Allergies: No Known Allergies  No medications prior to admission.    Review of Systems  Constitutional: Negative.   HENT: Negative.    Eyes: Negative.   Respiratory: Negative.    Cardiovascular: Negative.   Gastrointestinal: Negative.   Endocrine: Negative.   Genitourinary:  Positive for pelvic pain (increases with movement) and vaginal pain (irritation/itching).  Musculoskeletal: Negative.   Skin: Negative.   Allergic/Immunologic: Negative.   Neurological: Negative.   Hematological: Negative.   Psychiatric/Behavioral: Negative.     Physical Exam   Blood pressure 136/84, pulse (!) 101, temperature 98.1 F (36.7 C), temperature source Oral, resp. rate 17, height 5\' 2"  (1.575 m), weight 82.7 kg, last menstrual period 02/20/2023, SpO2 97%.  Physical Exam Vitals and nursing note reviewed.  Constitutional:      Appearance: Normal appearance. She is normal weight.  Neurological:     Mental Status: She is alert.     MAU Course  Procedures  MDM CCUA Wet Prep GC/CT  Recent Results (from the past 2160 hours)  Urinalysis, Routine w  reflex microscopic -Urine, Clean Catch     Status: Abnormal   Collection Time: 09/03/23  4:51 PM  Result Value Ref Range   Color, Urine YELLOW YELLOW   APPearance HAZY (A) CLEAR   Specific Gravity, Urine 1.015 1.005 - 1.030   pH 7.0 5.0 - 8.0   Glucose, UA NEGATIVE NEGATIVE mg/dL   Hgb urine dipstick NEGATIVE NEGATIVE   Bilirubin Urine NEGATIVE NEGATIVE   Ketones, ur NEGATIVE NEGATIVE mg/dL   Protein, ur NEGATIVE NEGATIVE mg/dL   Nitrite NEGATIVE NEGATIVE   Leukocytes,Ua NEGATIVE NEGATIVE    Comment: Performed at [facility], [address], [place], [place] [address]  Wet prep, genital     Status: Abnormal   Collection Time: 09/03/23  4:51 PM   Specimen: Urine, Clean Catch  Result Value Ref Range   Yeast Wet Prep HPF POC NONE SEEN NONE SEEN   Trich, Wet Prep NONE SEEN NONE SEEN   Clue Cells Wet Prep HPF POC NONE SEEN NONE  SEEN   WBC, Wet Prep HPF POC >=10 (A) <10   Sperm NONE SEEN     Comment: Performed at [facility], [address] [place], [place] [address]  GC/Chlamydia probe amp ([place])not at [facility]     Status: None   Collection Time: 09/03/23  4:51 PM  Result Value Ref Range   Neisseria Gonorrhea Negative    Chlamydia Negative    Comment Normal Reference Ranger Chlamydia - Negative    Comment      Normal Reference Range Neisseria Gonorrhea - Negative    Assessment and Plan  1. Vaginal irritation (Primary) - Prescription for: Terazol cream 0.4% vaginally hs and externally for external irritation - Information provided on vaginal yeast infection and alternative vaginitis therapies   2. Round ligament pain - Information provided on RLP   3. [redacted] weeks gestation of pregnancy   - Discharge patient - Keep scheduled appt with [facility] on 09/06/2023 - Patient verbalized an understanding of the plan of care and agrees.   Raelyn Mora, CNM 09/03/2023, 7:24 PM

## 2023-09-13 ENCOUNTER — Ambulatory Visit: Payer: Medicaid Other | Admitting: Obstetrics & Gynecology

## 2023-09-13 VITALS — BP 134/83 | HR 125 | Wt 184.0 lb

## 2023-09-13 DIAGNOSIS — Z1339 Encounter for screening examination for other mental health and behavioral disorders: Secondary | ICD-10-CM

## 2023-09-13 DIAGNOSIS — Z98891 History of uterine scar from previous surgery: Secondary | ICD-10-CM

## 2023-09-13 DIAGNOSIS — O0990 Supervision of high risk pregnancy, unspecified, unspecified trimester: Secondary | ICD-10-CM

## 2023-09-13 DIAGNOSIS — O0993 Supervision of high risk pregnancy, unspecified, third trimester: Secondary | ICD-10-CM | POA: Diagnosis not present

## 2023-09-13 DIAGNOSIS — Z3A29 29 weeks gestation of pregnancy: Secondary | ICD-10-CM | POA: Diagnosis not present

## 2023-09-13 NOTE — Progress Notes (Signed)
 ROB, needs GTT labs

## 2023-09-13 NOTE — Progress Notes (Signed)
   PRENATAL VISIT NOTE  Subjective:  Deborah Wells is a 28 y.o. G2P1001 at [redacted]w[redacted]d being seen today for ongoing prenatal care.  She is currently monitored for the following issues for this high-risk pregnancy and has S/P emergency cesarean section for fetal bradycardia; History of gestational hypertension; History of cesarean section; Frequent headaches; and Supervision of high risk pregnancy, antepartum on their problem list.  Patient reports no complaints.  Contractions: Not present. Vag. Bleeding: None.  Movement: Present. Denies leaking of fluid.   The following portions of the patient's history were reviewed and updated as appropriate: allergies, current medications, past family history, past medical history, past social history, past surgical history and problem list.   Objective:   Vitals:   09/13/23 1119  BP: 134/83  Pulse: (!) 125  Weight: 184 lb (83.5 kg)    Fetal Status: Fetal Heart Rate (bpm): 148   Movement: Present     General:  Alert, oriented and cooperative. Patient is in no acute distress.  Skin: Skin is warm and dry. No rash noted.   Cardiovascular: Normal heart rate noted  Respiratory: Normal respiratory effort, no problems with respiration noted  Abdomen: Soft, gravid, appropriate for gestational age.  Pain/Pressure: Absent     Pelvic: Cervical exam deferred        Extremities: Normal range of motion.  Edema: None  Mental Status: Normal mood and affect. Normal behavior. Normal judgment and thought content.   Assessment and Plan:  Pregnancy: G2P1001 at [redacted]w[redacted]d 1. Supervision of high risk pregnancy, antepartum (Primary)   2. History of cesarean section Consents to [facility] after discussion and signed form  Preterm labor symptoms and general obstetric precautions including but not limited to vaginal bleeding, contractions, leaking of fluid and fetal movement were reviewed in detail with the patient. Please refer to After Visit Summary for other counseling  recommendations.   Return in about 2 weeks (around 09/27/2023).  Future Appointments  Date Time Provider Department Center  09/27/2023  8:35 AM [facility] None    Scheryl Darter, MD

## 2023-09-27 ENCOUNTER — Other Ambulatory Visit: Payer: Medicaid Other

## 2023-09-28 ENCOUNTER — Encounter: Payer: Medicaid Other | Admitting: Obstetrics & Gynecology

## 2023-09-29 ENCOUNTER — Other Ambulatory Visit: Payer: Medicaid Other

## 2023-09-29 ENCOUNTER — Ambulatory Visit (INDEPENDENT_AMBULATORY_CARE_PROVIDER_SITE_OTHER): Payer: Medicaid Other | Admitting: Obstetrics & Gynecology

## 2023-09-29 ENCOUNTER — Encounter: Admitting: Obstetrics

## 2023-09-29 VITALS — BP 118/80 | HR 101 | Wt 186.0 lb

## 2023-09-29 DIAGNOSIS — O0990 Supervision of high risk pregnancy, unspecified, unspecified trimester: Secondary | ICD-10-CM | POA: Diagnosis not present

## 2023-09-29 DIAGNOSIS — Z98891 History of uterine scar from previous surgery: Secondary | ICD-10-CM | POA: Diagnosis not present

## 2023-09-29 DIAGNOSIS — Z3A31 31 weeks gestation of pregnancy: Secondary | ICD-10-CM | POA: Diagnosis not present

## 2023-09-29 DIAGNOSIS — Z8759 Personal history of other complications of pregnancy, childbirth and the puerperium: Secondary | ICD-10-CM | POA: Diagnosis not present

## 2023-09-29 MED ORDER — PANTOPRAZOLE SODIUM 20 MG PO TBEC: 20.0000 mg | DELAYED_RELEASE_TABLET | Freq: Every day | ORAL | 1 refills | Status: AC

## 2023-09-29 NOTE — Progress Notes (Signed)
   PRENATAL VISIT NOTE  Subjective:  Deborah Wells is a 28 y.o. G2P1001 at [redacted]w[redacted]d being seen today for ongoing prenatal care.  She is currently monitored for the following issues for this high-risk pregnancy and has S/P emergency cesarean section for fetal bradycardia; History of gestational hypertension; History of cesarean section; Frequent headaches; and Supervision of high risk pregnancy, antepartum on their problem list.  Patient reports heartburn.  Contractions: Irritability. Vag. Bleeding: None.  Movement: Present. Denies leaking of fluid.   The following portions of the patient's history were reviewed and updated as appropriate: allergies, current medications, past family history, past medical history, past social history, past surgical history and problem list.   Objective:   Vitals:   09/29/23 0837  BP: 118/80  Pulse: (!) 101  Weight: 186 lb (84.4 kg)    Fetal Status: Fetal Heart Rate (bpm): 150   Movement: Present     General:  Alert, oriented and cooperative. Patient is in no acute distress.  Skin: Skin is warm and dry. No rash noted.   Cardiovascular: Normal heart rate noted  Respiratory: Normal respiratory effort, no problems with respiration noted  Abdomen: Soft, gravid, appropriate for gestational age.  Pain/Pressure: Present     Pelvic: Cervical exam deferred        Extremities: Normal range of motion.  Edema: Trace  Mental Status: Normal mood and affect. Normal behavior. Normal judgment and thought content.   Assessment and Plan:  Pregnancy: G2P1001 at [redacted]w[redacted]d 1. Supervision of high risk pregnancy, antepartum (Primary) Could not tolerate glucose load will do 1 hr not fasting next week - Glucose Tolerance, 2 Hours w/1 Hour - CBC - HIV Antibody (routine testing w rflx) - RPR  2. [redacted] weeks gestation of pregnancy  - Glucose Tolerance, 2 Hours w/1 Hour - CBC - HIV Antibody (routine testing w rflx) - RPR - pantoprazole (PROTONIX) 20 MG tablet; Take 1 tablet  (20 mg total) by mouth daily.  Dispense: 30 tablet; Refill: 1  3. S/P emergency cesarean section for fetal bradycardia TOLAC  4. History of cesarean section   5. History of gestational hypertension   Preterm labor symptoms and general obstetric precautions including but not limited to vaginal bleeding, contractions, leaking of fluid and fetal movement were reviewed in detail with the patient. Please refer to After Visit Summary for other counseling recommendations.   Return in about 1 week (around 10/06/2023).  Future Appointments  Date Time Provider Department Center  10/06/2023  8:15 AM [facility] None    Scheryl Darter, MD

## 2023-09-29 NOTE — Progress Notes (Signed)
 Pt is unable to tolerate GTT drink today, ? Nausea meds prior to testing.

## 2023-10-03 LAB — CBC
Hematocrit: 32.7 % — ABNORMAL LOW (ref 34.0–46.6)
Hemoglobin: 10 g/dL — ABNORMAL LOW (ref 11.1–15.9)
MCH: 25.8 pg — ABNORMAL LOW (ref 26.6–33.0)
MCHC: 30.6 g/dL — ABNORMAL LOW (ref 31.5–35.7)
MCV: 84 fL (ref 79–97)
Platelets: 377 10*3/uL (ref 150–450)
RBC: 3.88 x10E6/uL (ref 3.77–5.28)
RDW: 13.1 % (ref 11.7–15.4)
WBC: 8.6 10*3/uL (ref 3.4–10.8)

## 2023-10-03 LAB — GLUCOSE TOLERANCE, 2 HOURS W/ 1HR: Glucose, Fasting: 103 mg/dL — ABNORMAL HIGH (ref 70–91)

## 2023-10-03 LAB — HIV ANTIBODY (ROUTINE TESTING W REFLEX): HIV Screen 4th Generation wRfx: NONREACTIVE

## 2023-10-03 LAB — RPR: RPR Ser Ql: NONREACTIVE

## 2023-10-06 ENCOUNTER — Other Ambulatory Visit

## 2023-10-06 ENCOUNTER — Encounter: Admitting: Obstetrics

## 2023-10-23 ENCOUNTER — Encounter (HOSPITAL_COMMUNITY): Payer: Self-pay | Admitting: Obstetrics and Gynecology

## 2023-10-23 ENCOUNTER — Inpatient Hospital Stay (HOSPITAL_COMMUNITY)
Admission: AD | Admit: 2023-10-23 | Discharge: 2023-10-23 | Disposition: A | Discharge status: Home / Self Care | Attending: Obstetrics and Gynecology | Admitting: Obstetrics and Gynecology

## 2023-10-23 ENCOUNTER — Other Ambulatory Visit (HOSPITAL_COMMUNITY)
Admission: RE | Admit: 2023-10-23 | Discharge: 2023-10-23 | Disposition: A | Discharge status: Home / Self Care | Source: Ambulatory Visit | Attending: Obstetrics and Gynecology | Admitting: Obstetrics and Gynecology

## 2023-10-23 ENCOUNTER — Ambulatory Visit (INDEPENDENT_AMBULATORY_CARE_PROVIDER_SITE_OTHER): Admitting: Obstetrics & Gynecology

## 2023-10-23 ENCOUNTER — Other Ambulatory Visit

## 2023-10-23 ENCOUNTER — Encounter: Payer: Self-pay | Admitting: Obstetrics & Gynecology

## 2023-10-23 VITALS — BP 137/94 | HR 106 | Wt 193.0 lb

## 2023-10-23 DIAGNOSIS — O0990 Supervision of high risk pregnancy, unspecified, unspecified trimester: Secondary | ICD-10-CM

## 2023-10-23 DIAGNOSIS — Z3A35 35 weeks gestation of pregnancy: Secondary | ICD-10-CM | POA: Insufficient documentation

## 2023-10-23 DIAGNOSIS — O09293 Supervision of pregnancy with other poor reproductive or obstetric history, third trimester: Secondary | ICD-10-CM | POA: Insufficient documentation

## 2023-10-23 DIAGNOSIS — O133 Gestational [pregnancy-induced] hypertension without significant proteinuria, third trimester: Secondary | ICD-10-CM | POA: Diagnosis not present

## 2023-10-23 DIAGNOSIS — R7301 Impaired fasting glucose: Secondary | ICD-10-CM

## 2023-10-23 DIAGNOSIS — Z98891 History of uterine scar from previous surgery: Secondary | ICD-10-CM | POA: Diagnosis not present

## 2023-10-23 DIAGNOSIS — Z8759 Personal history of other complications of pregnancy, childbirth and the puerperium: Secondary | ICD-10-CM | POA: Diagnosis not present

## 2023-10-23 DIAGNOSIS — O10913 Unspecified pre-existing hypertension complicating pregnancy, third trimester: Secondary | ICD-10-CM | POA: Insufficient documentation

## 2023-10-23 DIAGNOSIS — R519 Headache, unspecified: Secondary | ICD-10-CM | POA: Insufficient documentation

## 2023-10-23 LAB — COMPREHENSIVE METABOLIC PANEL WITH GFR
ALT: 10 U/L (ref 0–44)
AST: 14 U/L — ABNORMAL LOW (ref 15–41)
Albumin: 2.5 g/dL — ABNORMAL LOW (ref 3.5–5.0)
Alkaline Phosphatase: 74 U/L (ref 38–126)
Anion gap: 11 (ref 5–15)
BUN: 7 mg/dL (ref 6–20)
CO2: 20 mmol/L — ABNORMAL LOW (ref 22–32)
Calcium: 8.8 mg/dL — ABNORMAL LOW (ref 8.9–10.3)
Chloride: 105 mmol/L (ref 98–111)
Creatinine, Ser: 0.61 mg/dL (ref 0.44–1.00)
GFR, Estimated: >60 mL/min (ref >60)
Glucose, Bld: 97 mg/dL (ref 70–99)
Potassium: 4.2 mmol/L (ref 3.5–5.1)
Sodium: 136 mmol/L (ref 135–145)
Total Bilirubin: 0.5 mg/dL (ref 0.0–1.2)
Total Protein: 6.4 g/dL — ABNORMAL LOW (ref 6.5–8.1)

## 2023-10-23 LAB — PROTEIN / CREATININE RATIO, URINE
Creatinine, Urine: 169 mg/dL
Protein Creatinine Ratio: 0.15 mg/mg{creat} (ref 0.00–0.15)
Total Protein, Urine: 25 mg/dL

## 2023-10-23 LAB — CBC WITH DIFFERENTIAL/PLATELET
Abs Immature Granulocytes: 0.03 10*3/uL (ref 0.00–0.07)
Basophils Absolute: 0 10*3/uL (ref 0.0–0.1)
Basophils Relative: 1 %
Eosinophils Absolute: 0.1 10*3/uL (ref 0.0–0.5)
Eosinophils Relative: 2 %
HCT: 31.6 % — ABNORMAL LOW (ref 36.0–46.0)
Hemoglobin: 9.8 g/dL — ABNORMAL LOW (ref 12.0–15.0)
Immature Granulocytes: 1 %
Lymphocytes Relative: 27 %
Lymphs Abs: 1.7 10*3/uL (ref 0.7–4.0)
MCH: 24.9 pg — ABNORMAL LOW (ref 26.0–34.0)
MCHC: 31 g/dL (ref 30.0–36.0)
MCV: 80.2 fL (ref 80.0–100.0)
Monocytes Absolute: 0.7 10*3/uL (ref 0.1–1.0)
Monocytes Relative: 12 %
Neutro Abs: 3.7 10*3/uL (ref 1.7–7.7)
Neutrophils Relative %: 57 %
Platelets: 369 10*3/uL (ref 150–400)
RBC: 3.94 MIL/uL (ref 3.87–5.11)
RDW: 14.6 % (ref 11.5–15.5)
WBC: 6.2 10*3/uL (ref 4.0–10.5)
nRBC: 0 % (ref 0.0–0.2)

## 2023-10-23 LAB — URINALYSIS, ROUTINE W REFLEX MICROSCOPIC
Bilirubin Urine: NEGATIVE
Glucose, UA: NEGATIVE mg/dL
Hgb urine dipstick: NEGATIVE
Ketones, ur: NEGATIVE mg/dL
Leukocytes,Ua: NEGATIVE
Nitrite: NEGATIVE
Protein, ur: NEGATIVE mg/dL
Specific Gravity, Urine: 1.024 (ref 1.005–1.030)
pH: 6 (ref 5.0–8.0)

## 2023-10-23 MED ORDER — ACETAMINOPHEN-CAFFEINE 500-65 MG PO TABS
2.0000 | ORAL_TABLET | Freq: Once | ORAL | Status: AC
  Administered 2023-10-23: 2 via ORAL
  Filled 2023-10-23: qty 2

## 2023-10-23 NOTE — MAU Provider Note (Signed)
 History     CSN: [id number]  Arrival date and time: 10/23/23 0906   Event Date/Time   First Provider Initiated Contact with Patient 10/23/23 1042      Chief Complaint  Patient presents with   Hypertension   Headache   Deborah Wells , a  28 y.o. G2P1001 at [redacted]w[redacted]d presents to MAU after being sent over from the office for elevated BPs in the presence of a headache. Patient reports that she was at the office for her GTT and had several elevated BPs. She states that she also has been experiencing intermittent headaches and epigastric pain. She denies epigastric pain at this time, but endorses a headache that she had since arrival to office this morning. She denies attempting to relieve symptoms. She currently reports a 6/10 HA mainly across the forehead. She denies new onset of swelling or vaginal bleeding, leaking of fluid, contractions and urinary symptoms. She endorses positive fetal movement.   Patient has a hx of GHTN in a previous pregnancy with PreE.          OB History     Gravida  2   Para  1   Term  1   Preterm  0   AB  0   Living  1      SAB  0   IAB  0   Ectopic  0   Multiple  0   Live Births  1        Obstetric Comments  Gestational Hypertension w/first         Past Medical History:  Diagnosis Date   Asthma    Frequent headaches    Gonorrhea 01/24/2020   To get rocephin 7/9, POC________   Hypertension    pregnancy hypertension   Vaginal Pap smear, abnormal     Past Surgical History:  Procedure Laterality Date   CESAREAN SECTION N/A 06/21/2016   Procedure: CESAREAN SECTION;  Surgeon: Tereso Newcomer, MD;  Location: [facility];  Service: Obstetrics;  Laterality: N/A;   INSERTION OF NON VAGINAL CONTRACEPTIVE DEVICE  06/24/2016        Family History  Problem Relation Age of Onset   Diabetes Mother    Cancer Maternal Grandmother    Breast cancer Maternal Grandmother    Asthma Neg Hx    Heart disease Neg Hx     Hypertension Neg Hx     Social History   Tobacco Use   Smoking status: Never   Smokeless tobacco: Never  Vaping Use   Vaping status: Never Used  Substance Use Topics   Alcohol use: No   Drug use: Never    Allergies: No Known Allergies  No medications prior to admission.    Review of Systems  Constitutional:  Negative for chills, fatigue and fever.  Eyes:  Negative for pain and visual disturbance.  Respiratory:  Negative for apnea, shortness of breath and wheezing.   Cardiovascular:  Negative for chest pain and palpitations.  Gastrointestinal:  Positive for abdominal pain. Negative for constipation, diarrhea, nausea and vomiting.  Genitourinary:  Negative for difficulty urinating, dysuria, pelvic pain, vaginal bleeding, vaginal discharge and vaginal pain.  Musculoskeletal:  Negative for back pain.  Neurological:  Positive for headaches. Negative for seizures and weakness.  Psychiatric/Behavioral:  Negative for suicidal ideas.    Physical Exam   Blood pressure 139/77, pulse 96, temperature 98.5 F (36.9 C), temperature source Oral, resp. rate 18, height 5\' 2"  (1.575 m), weight 87.2 kg, last  menstrual period 02/20/2023, SpO2 99%.  Physical Exam Vitals and nursing note reviewed.  Constitutional:      General: She is not in acute distress.    Appearance: Normal appearance.  HENT:     Head: Normocephalic.  Pulmonary:     Effort: Pulmonary effort is normal.     Breath sounds: Normal breath sounds.  Abdominal:     Palpations: Abdomen is soft.     Tenderness: There is no abdominal tenderness.     Comments: Gravid Uterus   Musculoskeletal:        General: No swelling.     Cervical back: Normal range of motion.  Skin:    General: Skin is warm and dry.  Neurological:     Mental Status: She is alert and oriented to person, place, and time.     GCS: GCS eye subscore is 4. GCS verbal subscore is 5. GCS motor subscore is 6.     Gait: Gait normal.     Deep Tendon Reflexes:  Reflexes normal.  Psychiatric:        Mood and Affect: Mood normal.    FHT: 150bpm with moderate variability. Accels present no decels.  Toco: occasional contractions. Patient unaware.    MAU Course  Procedures Orders Placed This Encounter  Procedures   Urinalysis, Routine w reflex microscopic -Urine, Clean Catch   CBC with Differential/Platelet   Comprehensive metabolic panel   Protein / creatinine ratio, urine   Discharge patient Discharge disposition: 01-Home or Self Care; Discharge patient date: 10/23/2023   Patient Vitals for the past 24 hrs:  BP Temp Temp src Pulse Resp SpO2 Height Weight  10/23/23 1200 139/77 98.5 F (36.9 C) Oral 96 18 99 % -- --  10/23/23 1145 -- -- -- -- -- 100 % -- --  10/23/23 1130 -- -- -- -- -- 100 % -- --  10/23/23 1115 127/83 -- -- 92 -- 100 % -- --  10/23/23 1100 134/88 -- -- 92 -- 100 % -- --  10/23/23 0932 (!) 141/83 98.6 F (37 C) Oral 94 16 100 % 5\' 2"  (1.575 m) 87.2 kg   Several elevated BPs that will not rollover during downtime (please see flow sheet)   MDM - Elevated BPs but not severe range.  - PreE labs normal  - Headache resolved with PO Meds and Food  - Not concerning for PreE at this time. At best gestational hypertension.  - Reviewed patient presentation, labs and current clinical picture with Dr. Donavan Foil. Per MD patient stable for discharge at this and plan to have her IOL scheduled for 37 weeks.  - plan for discharge.   Assessment and Plan   1. Gestational hypertension, third trimester   2. [redacted] weeks gestation of pregnancy   3. History of gestational hypertension    - Reviewed worsening signs and return precautions.  - FHT appropriate for gestational age at time of discharge.  - Notified Dr. Marice Potter the provider that sent her of the POL and IOL at 37 weeks for [facility] of pregnancy.  - Patient discharged home in stable condition and may return to MAU as needed.   Claudette Head, MSN CNM  10/23/2023, 12:21 PM

## 2023-10-23 NOTE — Progress Notes (Signed)
   PRENATAL VISIT NOTE  Subjective:  Deborah Wells is a 28 y.o. G2P1001 at [redacted]w[redacted]d being seen today for ongoing prenatal care.  She is currently monitored for the following issues for this high-risk pregnancy and has S/P emergency cesarean section for fetal bradycardia; History of gestational hypertension; History of cesarean section; Frequent headaches; and Supervision of high risk pregnancy, antepartum on their problem list.  Patient reports  headaches for at least a week, sometimes is resolved with tylenol but not always. She denies visual changes but has had occasional RUQ and epigastric discomfort .  Contractions: Not present. Vag. Bleeding: None.  Movement: Present. Denies leaking of fluid.   The following portions of the patient's history were reviewed and updated as appropriate: allergies, current medications, past family history, past medical history, past social history, past surgical history and problem list.   Objective:   Vitals:   10/23/23 0818 10/23/23 0827  BP: (!) 154/93 (!) 137/94  Pulse: (!) 105 (!) 106  Weight: 193 lb (87.5 kg)     Fetal Status: Fetal Heart Rate (bpm): 176 Fundal Height: 35 cm Movement: Present  Presentation: Vertex  General:  Alert, oriented and cooperative. Patient is in no acute distress.  Skin: Skin is warm and dry. No rash noted.   Cardiovascular: Normal heart rate noted  Respiratory: Normal respiratory effort, no problems with respiration noted  Abdomen: Soft, gravid, appropriate for gestational age.  Pain/Pressure: Absent     Pelvic: Cervical exam performed in the presence of a chaperone Dilation: Closed Effacement (%): 90 Station: -2  Extremities: Normal range of motion.  Edema: None  Mental Status: Normal mood and affect. Normal behavior. Normal judgment and thought content.   No pedal edema DTR- 3+ bilaterally and equal  Assessment and Plan:  Pregnancy: G2P1001 at [redacted]w[redacted]d 1. History of cesarean section (Primary) - interested in  TOLAC  2. Supervision of high risk pregnancy, antepartum  - Cervicovaginal ancillary only( [place]) - Culture, beta strep (group b only)  3. S/P emergency cesarean section for fetal bradycardia   4. Elevated fasting blood sugar  - HgB A1c  - technically, she failed her 2 hour GTT due to the elevated fasting, even though she did not complete the test.  5. [redacted] weeks gestation of pregnancy  - Cervicovaginal ancillary only( [place]) - Culture, beta strep (group b only)   6. Elevated BP with symptoms of pre e. - gained 7 pounds in the last week with facial swelling noted by herself and partner.  She will be evaluated at the MAU   7. Elevated FHR- will get further monitoring at MAU   Preterm labor symptoms and general obstetric precautions including but not limited to vaginal bleeding, contractions, leaking of fluid and fetal movement were reviewed in detail with the patient. Please refer to After Visit Summary for other counseling recommendations.   Return in about 1 week (around 10/30/2023).  No future appointments.  Allie Bossier, MD

## 2023-10-23 NOTE — MAU Note (Signed)
.  Deborah Wells is a 28 y.o. at [redacted]w[redacted]d here in MAU reporting: Sent over from the office for PEC eval. She reports her BP's were elevated there. She reports they also told her that the baby had an elevated HR. Reports a current HA that began after leaving the office.  Hx GHTN. Previous C/S due to fetal intolerance. Desires TOLAC.  Onset of complaint: Today Pain score: 6/10 HA  Vitals:   10/23/23 0932  BP: (!) 141/83  Pulse: 94  Resp: 16  Temp: 98.6 F (37 C)  SpO2: 100%     FHT: 142 initial external Lab orders placed from triage: UA

## 2023-10-23 NOTE — Progress Notes (Signed)
 Pt presents for rob. Pt bp first reading was 154/93, second reading 137/94. Pt has no questions or concerns at this time.

## 2023-10-24 ENCOUNTER — Encounter (HOSPITAL_COMMUNITY): Payer: Self-pay | Admitting: Obstetrics and Gynecology

## 2023-10-24 ENCOUNTER — Telehealth (HOSPITAL_COMMUNITY): Payer: Self-pay | Admitting: *Deleted

## 2023-10-24 ENCOUNTER — Encounter (HOSPITAL_COMMUNITY): Payer: Self-pay | Admitting: *Deleted

## 2023-10-24 ENCOUNTER — Inpatient Hospital Stay (HOSPITAL_COMMUNITY)
Admission: AD | Admit: 2023-10-24 | Discharge: 2023-10-24 | Disposition: A | Discharge status: Home / Self Care | Attending: Obstetrics and Gynecology | Admitting: Obstetrics and Gynecology

## 2023-10-24 DIAGNOSIS — O133 Gestational [pregnancy-induced] hypertension without significant proteinuria, third trimester: Secondary | ICD-10-CM | POA: Diagnosis not present

## 2023-10-24 DIAGNOSIS — Z3689 Encounter for other specified antenatal screening: Secondary | ICD-10-CM

## 2023-10-24 DIAGNOSIS — Z3A35 35 weeks gestation of pregnancy: Secondary | ICD-10-CM

## 2023-10-24 DIAGNOSIS — G44209 Tension-type headache, unspecified, not intractable: Secondary | ICD-10-CM

## 2023-10-24 DIAGNOSIS — O99353 Diseases of the nervous system complicating pregnancy, third trimester: Secondary | ICD-10-CM | POA: Insufficient documentation

## 2023-10-24 DIAGNOSIS — O26893 Other specified pregnancy related conditions, third trimester: Secondary | ICD-10-CM

## 2023-10-24 LAB — HEMOGLOBIN A1C
Est. average glucose Bld gHb Est-mCnc: 134 mg/dL
Hgb A1c MFr Bld: 6.3 % — ABNORMAL HIGH (ref 4.8–5.6)

## 2023-10-24 LAB — CERVICOVAGINAL ANCILLARY ONLY
Chlamydia: NEGATIVE
Comment: NEGATIVE
Comment: NORMAL
Neisseria Gonorrhea: NEGATIVE

## 2023-10-24 MED ORDER — ACETAMINOPHEN-CAFFEINE 500-65 MG PO TABS: 2.0000 | ORAL_TABLET | Freq: Once | ORAL | Status: DC

## 2023-10-24 MED ORDER — PROCHLORPERAZINE MALEATE 10 MG PO TABS
10.0000 mg | ORAL_TABLET | Freq: Once | ORAL | Status: AC
  Administered 2023-10-24: 10 mg via ORAL
  Filled 2023-10-24: qty 1

## 2023-10-24 MED ORDER — ACETAMINOPHEN-CAFFEINE 500-65 MG PO TABS: 2.0000 | ORAL_TABLET | Freq: Three times a day (TID) | ORAL | Status: AC | PRN

## 2023-10-24 MED ORDER — CYCLOBENZAPRINE HCL 5 MG PO TABS
10.0000 mg | ORAL_TABLET | Freq: Once | ORAL | Status: AC
  Administered 2023-10-24: 10 mg via ORAL
  Filled 2023-10-24: qty 2

## 2023-10-24 MED ORDER — METOCLOPRAMIDE HCL 10 MG PO TABS
10.0000 mg | ORAL_TABLET | Freq: Once | ORAL | Status: AC
  Administered 2023-10-24: 10 mg via ORAL
  Filled 2023-10-24: qty 1

## 2023-10-24 MED ORDER — ACETAMINOPHEN-CAFFEINE 500-65 MG PO TABS
2.0000 | ORAL_TABLET | Freq: Once | ORAL | Status: AC
  Administered 2023-10-24: 2 via ORAL
  Filled 2023-10-24: qty 2

## 2023-10-24 MED ORDER — METOCLOPRAMIDE HCL 10 MG PO TABS
10.0000 mg | ORAL_TABLET | Freq: Four times a day (QID) | ORAL | 0 refills | Status: AC | PRN
Start: 2023-10-24 — End: ?

## 2023-10-24 MED ORDER — PROCHLORPERAZINE MALEATE 10 MG PO TABS: 10.0000 mg | ORAL_TABLET | Freq: Four times a day (QID) | ORAL | 3 refills | Status: DC | PRN

## 2023-10-24 MED ORDER — CYCLOBENZAPRINE HCL 10 MG PO TABS: 10.0000 mg | ORAL_TABLET | Freq: Three times a day (TID) | ORAL | 1 refills | Status: DC | PRN

## 2023-10-24 NOTE — Discharge Instructions (Signed)
 You were seen in the maternity assessment unit for a headache.  Your headache was improved after you received Reglan, Compazine, Excedrin tension and Flexeril.  It is important to continue to come to the MAU for any headaches that are not improved by the medications that you can take at home.  I have sent in prescriptions for Reglan, Compazine and Flexeril.  You can also use Excedrin tension over-the-counter and you can purchase this when you go to pick up your other medications.  You do have gestational hypertension and we do take headaches very seriously because you have this condition.  You should continue to engage with your normal prenatal care and should be seen at least weekly until delivery at 37 weeks.

## 2023-10-24 NOTE — MAU Provider Note (Signed)
 History     CSN: [id number]  Arrival date and time: 10/24/23 4098   Event Date/Time   First Provider Initiated Contact with Patient 10/24/23 [phone]      Chief Complaint  Patient presents with   Headache   Headache  Pertinent negatives include no abdominal pain, back pain, coughing, dizziness, eye pain, fever, nausea, sore throat or vomiting.    OB History     Gravida  2   Para  1   Term  1   Preterm  0   AB  0   Living  1      SAB  0   IAB  0   Ectopic  0   Multiple  0   Live Births  1        Obstetric Comments  Gestational Hypertension w/first         Past Medical History:  Diagnosis Date   Asthma    Frequent headaches    Gonorrhea 01/24/2020   To get rocephin 7/9, POC________   Hypertension    pregnancy hypertension   Vaginal Pap smear, abnormal     Past Surgical History:  Procedure Laterality Date   CESAREAN SECTION N/A 06/21/2016   Procedure: CESAREAN SECTION;  Surgeon: Tereso Newcomer, MD;  Location: [facility];  Service: Obstetrics;  Laterality: N/A;   INSERTION OF NON VAGINAL CONTRACEPTIVE DEVICE  06/24/2016        Family History  Problem Relation Age of Onset   Diabetes Mother    Cancer Maternal Grandmother    Breast cancer Maternal Grandmother    Asthma Neg Hx    Heart disease Neg Hx    Hypertension Neg Hx     Social History   Tobacco Use   Smoking status: Never   Smokeless tobacco: Never  Vaping Use   Vaping status: Never Used  Substance Use Topics   Alcohol use: No   Drug use: Never    Allergies: No Known Allergies  Medications Prior to Admission  Medication Sig Dispense Refill Last Dose/Taking   aspirin EC 81 MG tablet Take 1 tablet (81 mg total) by mouth daily. Start taking when you are [redacted] weeks pregnant for rest of pregnancy for prevention of preeclampsia 300 tablet 2 10/23/2023   Prenatal MV & Min w/FA-DHA (PRENATAL ADULT GUMMY/DHA/FA) 0.4-25 MG CHEW Take one pill daily 90 tablet 3 10/23/2023   Blood  Pressure Monitoring (BLOOD PRESSURE KIT) DEVI 1 Device by Does not apply route once a week. 1 each 0    pantoprazole (PROTONIX) 20 MG tablet Take 1 tablet (20 mg total) by mouth daily. (Patient not taking: Reported on 10/23/2023) 30 tablet 1    terconazole (TERAZOL 7) 0.4 % vaginal cream Place 1 applicator vaginally at bedtime. Use for seven days. Can also be applied externally for irritation. (Patient not taking: Reported on 10/23/2023) 45 g 0    [DISCONTINUED] cyclobenzaprine (FLEXERIL) 10 MG tablet Take 1 tablet (10 mg total) by mouth at bedtime. (Patient not taking: Reported on 10/23/2023) 30 tablet 0     Review of Systems  Constitutional:  Negative for chills and fever.  HENT:  Negative for congestion and sore throat.   Eyes:  Negative for pain and visual disturbance.  Respiratory:  Negative for cough, chest tightness and shortness of breath.   Cardiovascular:  Negative for chest pain.  Gastrointestinal:  Negative for abdominal pain, diarrhea, nausea and vomiting.  Endocrine: Negative for cold intolerance and heat intolerance.  Genitourinary:  Negative for dysuria and flank pain.  Musculoskeletal:  Negative for back pain.  Skin:  Negative for rash.  Allergic/Immunologic: Negative for food allergies.  Neurological:  Positive for headaches. Negative for dizziness and light-headedness.  Psychiatric/Behavioral:  Negative for agitation.    Physical Exam   Blood pressure 112/73, pulse 89, temperature 98.5 F (36.9 C), temperature source Oral, resp. rate 18, height 5\' 2"  (1.575 m), weight 86.5 kg, last menstrual period 02/20/2023, SpO2 99%.  Physical Exam Vitals and nursing note reviewed.  Constitutional:      General: She is not in acute distress.    Appearance: She is well-developed.     Comments: Pregnant female  HENT:     Head: Normocephalic and atraumatic.  Eyes:     General: No scleral icterus.    Conjunctiva/sclera: Conjunctivae normal.  Cardiovascular:     Rate and Rhythm:  Normal rate.  Pulmonary:     Effort: Pulmonary effort is normal.  Chest:     Chest wall: No tenderness.  Abdominal:     Palpations: Abdomen is soft.     Tenderness: There is no abdominal tenderness. There is no guarding or rebound.     Comments: Gravid  Genitourinary:    Vagina: Normal.  Musculoskeletal:        General: Normal range of motion.     Cervical back: Normal range of motion and neck supple.  Skin:    General: Skin is warm and dry.     Findings: No rash.  Neurological:     Mental Status: She is alert and oriented to person, place, and time.     MAU Course  Procedures  I reviewed the patient's fetal monitoring.  Baseline HR: 145 Variability:  moderate Accels:present Decels: none  A/P: Reactive NST  Reassured regarding fetal status.   MDM- HIGH Known gHTN, concerning with HA as this would be a severe feature but also has history of HA prior to pregnancy and MAU visits in early pregnancy for HA. Was seen in MAU 4/7 for same but has not taken home medication and ran out of flexeril.   10:12 AM HA improving. Has some nausea. Ordered compazine  Orders placed this ER visit: Meds ordered this encounter  Medications   acetaminophen-caffeine (EXCEDRIN TENSION HEADACHE) 500-65 MG per tablet 2 tablet   cyclobenzaprine (FLEXERIL) tablet 10 mg   metoCLOPramide (REGLAN) tablet 10 mg   prochlorperazine (COMPAZINE) tablet 10 mg   prochlorperazine (COMPAZINE) 10 MG tablet    Sig: Take 1 tablet (10 mg total) by mouth every 6 (six) hours as needed for nausea (headache).    Dispense:  30 tablet    Refill:  3   cyclobenzaprine (FLEXERIL) 10 MG tablet    Sig: Take 1 tablet (10 mg total) by mouth 3 (three) times daily as needed for muscle spasms (or headache).    Dispense:  30 tablet    Refill:  1   acetaminophen-caffeine (EXCEDRIN TENSION HEADACHE) 500-65 MG per tablet 2 tablet   metoCLOPramide (REGLAN) 10 MG tablet    Sig: Take 1 tablet (10 mg total) by mouth 4 (four) times  daily as needed for nausea or vomiting (or headache).    Dispense:  30 tablet    Refill:  0    12:03 PM improved symptoms.desires discharge   Assessment and Plan   1. Acute non intractable tension-type headache   2. Gestational hypertension, third trimester   3. [redacted] weeks gestation of pregnancy   4. NST (non-stress test)  reactive    - Discharge home in stable condition - Reviewed when to return to MAU - Has scheduled follow up 4/14 - Recommend weekly BPPs (message send to [facility] office)  Future Appointments  Date Time Provider Department Center  10/30/2023  2:50 PM Anyanwu, Jethro Bastos, MD [facility] None  11/06/2023 12:00 AM [facility]ED [facility] None     Isa Rankin Leonardo Makris 10/24/2023, 12:01 PM

## 2023-10-24 NOTE — Telephone Encounter (Signed)
 Preadmission screen

## 2023-10-24 NOTE — MAU Note (Signed)
.  Deborah Wells is a 28 y.o. at [redacted]w[redacted]d here in MAU reporting: Woke up with a HA. Last took Tylenol last night around 2300. None today. Ran out of her Flexeril. Evaluated in MAU yesterday. Had a HA. Received Excedrin tension HA which relieved her HA slightly. Does not have Excedrin at home. Denies VB or LOF. +FM.  Did not drive today. Hx of HA's. OB aware. Visit from 11/6 states HA resolved with Fioricet, Magnesium, and Reglan.  Onset of complaint: Last night Pain score: 10/10 HA  Vitals:   10/24/23 0828  BP: 138/86  Pulse: 99  Resp: 18  Temp: 98.5 F (36.9 C)  SpO2: 100%      FHT: 140 initial external Lab orders placed from triage: none

## 2023-10-27 ENCOUNTER — Encounter (HOSPITAL_COMMUNITY): Payer: Self-pay | Admitting: Obstetrics & Gynecology

## 2023-10-27 ENCOUNTER — Inpatient Hospital Stay (HOSPITAL_COMMUNITY)
Admission: AD | Admit: 2023-10-27 | Discharge: 2023-10-27 | Disposition: A | Discharge status: Home / Self Care | Attending: Obstetrics & Gynecology | Admitting: Obstetrics & Gynecology

## 2023-10-27 DIAGNOSIS — O133 Gestational [pregnancy-induced] hypertension without significant proteinuria, third trimester: Secondary | ICD-10-CM

## 2023-10-27 DIAGNOSIS — O4700 False labor before 37 completed weeks of gestation, unspecified trimester: Secondary | ICD-10-CM

## 2023-10-27 DIAGNOSIS — O4703 False labor before 37 completed weeks of gestation, third trimester: Secondary | ICD-10-CM | POA: Diagnosis present

## 2023-10-27 DIAGNOSIS — Z3689 Encounter for other specified antenatal screening: Secondary | ICD-10-CM

## 2023-10-27 DIAGNOSIS — Z3A35 35 weeks gestation of pregnancy: Secondary | ICD-10-CM | POA: Diagnosis not present

## 2023-10-27 DIAGNOSIS — O0990 Supervision of high risk pregnancy, unspecified, unspecified trimester: Secondary | ICD-10-CM

## 2023-10-27 LAB — CBC
HCT: 31.8 % — ABNORMAL LOW (ref 36.0–46.0)
Hemoglobin: 10.2 g/dL — ABNORMAL LOW (ref 12.0–15.0)
MCH: 25.1 pg — ABNORMAL LOW (ref 26.0–34.0)
MCHC: 32.1 g/dL (ref 30.0–36.0)
MCV: 78.3 fL — ABNORMAL LOW (ref 80.0–100.0)
Platelets: 394 10*3/uL (ref 150–400)
RBC: 4.06 MIL/uL (ref 3.87–5.11)
RDW: 14.6 % (ref 11.5–15.5)
WBC: 11.3 10*3/uL — ABNORMAL HIGH (ref 4.0–10.5)
nRBC: 0.2 % (ref 0.0–0.2)

## 2023-10-27 LAB — COMPREHENSIVE METABOLIC PANEL WITH GFR
ALT: 10 U/L (ref 0–44)
AST: 15 U/L (ref 15–41)
Albumin: 2.7 g/dL — ABNORMAL LOW (ref 3.5–5.0)
Alkaline Phosphatase: 80 U/L (ref 38–126)
Anion gap: 10 (ref 5–15)
BUN: 7 mg/dL (ref 6–20)
CO2: 18 mmol/L — ABNORMAL LOW (ref 22–32)
Calcium: 9.1 mg/dL (ref 8.9–10.3)
Chloride: 107 mmol/L (ref 98–111)
Creatinine, Ser: 0.67 mg/dL (ref 0.44–1.00)
GFR, Estimated: >60 mL/min (ref >60)
Glucose, Bld: 96 mg/dL (ref 70–99)
Potassium: 4.2 mmol/L (ref 3.5–5.1)
Sodium: 135 mmol/L (ref 135–145)
Total Bilirubin: 0.5 mg/dL (ref 0.0–1.2)
Total Protein: 6.7 g/dL (ref 6.5–8.1)

## 2023-10-27 LAB — URINALYSIS, ROUTINE W REFLEX MICROSCOPIC
Bilirubin Urine: NEGATIVE
Glucose, UA: NEGATIVE mg/dL
Hgb urine dipstick: NEGATIVE
Ketones, ur: NEGATIVE mg/dL
Nitrite: NEGATIVE
Protein, ur: 30 mg/dL — AB
Specific Gravity, Urine: 1.018 (ref 1.005–1.030)
pH: 7 (ref 5.0–8.0)

## 2023-10-27 LAB — PROTEIN / CREATININE RATIO, URINE
Creatinine, Urine: 111 mg/dL
Protein Creatinine Ratio: 0.23 mg/mg{creat} — ABNORMAL HIGH (ref 0.00–0.15)
Total Protein, Urine: 25 mg/dL

## 2023-10-27 LAB — CULTURE, BETA STREP (GROUP B ONLY): Strep Gp B Culture: NEGATIVE

## 2023-10-27 MED ORDER — LABETALOL HCL 100 MG PO TABS: 100.0000 mg | ORAL_TABLET | Freq: Two times a day (BID) | ORAL | 1 refills | Status: DC

## 2023-10-27 MED ORDER — NIFEDIPINE 10 MG PO CAPS
10.0000 mg | ORAL_CAPSULE | ORAL | Status: DC | PRN
  Administered 2023-10-27 (×2): 10 mg via ORAL
  Filled 2023-10-27 (×2): qty 1

## 2023-10-27 MED ORDER — LABETALOL HCL 100 MG PO TABS
100.0000 mg | ORAL_TABLET | Freq: Once | ORAL | Status: AC
  Administered 2023-10-27: 100 mg via ORAL
  Filled 2023-10-27: qty 1

## 2023-10-27 MED ORDER — TERCONAZOLE 0.8 % VA CREA: 1.0000 | TOPICAL_CREAM | Freq: Every day | VAGINAL | 0 refills | Status: AC

## 2023-10-27 NOTE — Progress Notes (Signed)
 RN entered pt room to give 2nd dose of procardia - pt stating "after this dose can I go home?" CNM made aware.

## 2023-10-27 NOTE — MAU Provider Note (Signed)
 Event Date/Time   First Provider Initiated Contact with Patient 10/27/23 0109       S: [name] is a 28 y.o. G2P1001 at [redacted]w[redacted]d  who presents to MAU today complaining contractions q 4-5 minutes that started at 1900. She denies vaginal bleeding. She denies LOF. She reports normal fetal movement.  She endorses good fluid intake today. She denies HA, visual disturbances, RUQ pain, or SOB.   O: BP (!) 147/86   Pulse 90   Temp 98.4 F (36.9 C)   Resp 18   Ht 5\' 2"  (1.575 m)   Wt 87.5 kg   LMP 02/20/2023 (Exact Date)   SpO2 99%   BMI 35.30 kg/m   Results for orders placed or performed during the hospital encounter of 10/27/23 (from the past 24 hours)  Urinalysis, Routine w reflex microscopic -Urine, Clean Catch     Status: Abnormal   Collection Time: 10/27/23 12:30 AM  Result Value Ref Range   Color, Urine YELLOW YELLOW   APPearance CLOUDY (A) CLEAR   Specific Gravity, Urine 1.018 1.005 - 1.030   pH 7.0 5.0 - 8.0   Glucose, UA NEGATIVE NEGATIVE mg/dL   Hgb urine dipstick NEGATIVE NEGATIVE   Bilirubin Urine NEGATIVE NEGATIVE   Ketones, ur NEGATIVE NEGATIVE mg/dL   Protein, ur 30 (A) NEGATIVE mg/dL   Nitrite NEGATIVE NEGATIVE   Leukocytes,Ua LARGE (A) NEGATIVE   RBC / HPF 6-10 0 - 5 RBC/hpf   WBC, UA 11-20 0 - 5 WBC/hpf   Bacteria, UA FEW (A) NONE SEEN   Squamous Epithelial / HPF 21-50 0 - 5 /HPF   Mucus PRESENT   Protein / creatinine ratio, urine     Status: Abnormal   Collection Time: 10/27/23 12:30 AM  Result Value Ref Range   Creatinine, Urine 111 mg/dL   Total Protein, Urine 25 mg/dL   Protein Creatinine Ratio 0.23 (H) 0.00 - 0.15 mg/mg[Cre]  CBC     Status: Abnormal   Collection Time: 10/27/23  2:17 AM  Result Value Ref Range   WBC 11.3 (H) 4.0 - 10.5 K/uL   RBC 4.06 3.87 - 5.11 MIL/uL   Hemoglobin 10.2 (L) 12.0 - 15.0 g/dL   HCT 16.1 (L) 09.6 - 04.5 %   MCV 78.3 (L) 80.0 - 100.0 fL   MCH 25.1 (L) 26.0 - 34.0 pg   MCHC 32.1 30.0 - 36.0 g/dL   RDW 40.9  81.1 - 91.4 %   Platelets 394 150 - 400 K/uL   nRBC 0.2 0.0 - 0.2 %  Comprehensive metabolic panel with GFR     Status: Abnormal   Collection Time: 10/27/23  2:17 AM  Result Value Ref Range   Sodium 135 135 - 145 mmol/L   Potassium 4.2 3.5 - 5.1 mmol/L   Chloride 107 98 - 111 mmol/L   CO2 18 (L) 22 - 32 mmol/L   Glucose, Bld 96 70 - 99 mg/dL   BUN 7 6 - 20 mg/dL   Creatinine, Ser 7.82 0.44 - 1.00 mg/dL   Calcium 9.1 8.9 - 95.6 mg/dL   Total Protein 6.7 6.5 - 8.1 g/dL   Albumin 2.7 (L) 3.5 - 5.0 g/dL   AST 15 15 - 41 U/L   ALT 10 0 - 44 U/L   Alkaline Phosphatase 80 38 - 126 U/L   Total Bilirubin 0.5 0.0 - 1.2 mg/dL   GFR, Estimated >21 >30 mL/min   Anion gap 10 5 - 15  GENERAL: Well-developed, well-nourished female in no acute distress.  HEAD: Normocephalic, atraumatic.  CHEST: Normal effort of breathing, regular heart rate ABDOMEN: Soft RT, gravid  Cervical exam:  Dilation: Closed Effacement (%): 80, 90 Station: -2 Exam by:: Gerrit Heck, CNM   Fetal Monitoring: Baseline: 145 Variability: Moderate Accelerations: Present 15x15 Decelerations: None Contractions: Q3-4min   A: SIUP at [redacted]w[redacted]d  Cat I FT GHTN Preterm Contractions  P: -Informed of cervical exam that is not reflective of labor. -However, evaluation of exam glove c/w yeast. Patient endorses itching. -Discussed treatment with Terazol and patient agreeable.  -Discussed procardia for ctx and patient agreeable. -Blood pressure elevated, but not severe. Known GHTN. -Monitor and consider labs if necessary.  -Give procardia per protocol for ctx.  -NST reactive  Gerrit Heck, CNM 10/27/2023 1:09 AM  Reassessment (2:08 AM) -BP consistently elevated, but not severe. -Labs ordered. PC Ratio added on to previous urine. -Urine also sent for culture considering few bacteria.  -Dr. Marquis Lunch. Anyanwu consulted and informed of patient status, evaluation, interventions, and results. Advised: *Start on Labetalol  100mg  BID. -Will give initial dose now. -Patient reporting some improvement with procardia dosing, but declines further treatment. -Recommendations reviewed. Patient agreeable. Informed that she will be contacted, via phone or mychart, for abnormal results.  -Precautions reviewed. -Encouraged to call primary office or return to MAU if symptoms worsen or with the onset of new symptoms. -Keep next appt as scheduled. -Discharged to home in stable condition.  Cherre Robins MSN, CNM Advanced Practice Provider, [facility] [employer] [facility]

## 2023-10-27 NOTE — MAU Note (Signed)
.  Deborah Wells is a 28 y.o. at [redacted]w[redacted]d here in MAU reporting ctxs or cramping since 1800. Pain has gotten worse over the evening. Reports baby has not moved as much as usual since 1600. Denies LOF or VB  LMP: n/a Onset of complaint: 1600 Pain score: 10 Vitals:   10/27/23 0018 10/27/23 0019  BP:  (!) 139/90  Pulse: 88   Resp: 18   Temp: 98.4 F (36.9 C)   SpO2: 100%      FHT: 158  Lab orders placed from triage: u/a and labor eval

## 2023-10-28 LAB — CULTURE, OB URINE

## 2023-10-30 ENCOUNTER — Encounter (HOSPITAL_COMMUNITY): Payer: Self-pay | Admitting: Obstetrics & Gynecology

## 2023-10-30 ENCOUNTER — Encounter: Payer: Self-pay | Admitting: Obstetrics & Gynecology

## 2023-10-30 ENCOUNTER — Other Ambulatory Visit (HOSPITAL_COMMUNITY)
Admission: RE | Admit: 2023-10-30 | Discharge: 2023-10-30 | Disposition: A | Discharge status: Home / Self Care | Source: Ambulatory Visit | Attending: Obstetrics & Gynecology | Admitting: Obstetrics & Gynecology

## 2023-10-30 ENCOUNTER — Inpatient Hospital Stay (HOSPITAL_COMMUNITY): Admitting: Anesthesiology

## 2023-10-30 ENCOUNTER — Inpatient Hospital Stay (HOSPITAL_COMMUNITY)
Admission: AD | Admit: 2023-10-30 | Discharge: 2023-11-02 | DRG: 807 | Disposition: A | Discharge status: Home / Self Care | Attending: Obstetrics & Gynecology | Admitting: Obstetrics & Gynecology

## 2023-10-30 ENCOUNTER — Ambulatory Visit (INDEPENDENT_AMBULATORY_CARE_PROVIDER_SITE_OTHER): Admitting: Obstetrics & Gynecology

## 2023-10-30 ENCOUNTER — Other Ambulatory Visit: Payer: Self-pay

## 2023-10-30 VITALS — BP 144/96 | HR 103 | Wt 195.0 lb

## 2023-10-30 DIAGNOSIS — Z98891 History of uterine scar from previous surgery: Secondary | ICD-10-CM

## 2023-10-30 DIAGNOSIS — O1414 Severe pre-eclampsia complicating childbirth: Secondary | ICD-10-CM | POA: Diagnosis present

## 2023-10-30 DIAGNOSIS — Z3A36 36 weeks gestation of pregnancy: Secondary | ICD-10-CM

## 2023-10-30 DIAGNOSIS — O34211 Maternal care for low transverse scar from previous cesarean delivery: Secondary | ICD-10-CM | POA: Diagnosis not present

## 2023-10-30 DIAGNOSIS — Z148 Genetic carrier of other disease: Secondary | ICD-10-CM

## 2023-10-30 DIAGNOSIS — O0990 Supervision of high risk pregnancy, unspecified, unspecified trimester: Secondary | ICD-10-CM | POA: Diagnosis not present

## 2023-10-30 DIAGNOSIS — O1413 Severe pre-eclampsia, third trimester: Principal | ICD-10-CM | POA: Diagnosis present

## 2023-10-30 DIAGNOSIS — O9902 Anemia complicating childbirth: Secondary | ICD-10-CM | POA: Diagnosis present

## 2023-10-30 DIAGNOSIS — O34219 Maternal care for unspecified type scar from previous cesarean delivery: Principal | ICD-10-CM | POA: Diagnosis present

## 2023-10-30 DIAGNOSIS — Z833 Family history of diabetes mellitus: Secondary | ICD-10-CM | POA: Diagnosis not present

## 2023-10-30 LAB — COMPREHENSIVE METABOLIC PANEL WITH GFR
ALT: 10 U/L (ref 0–44)
AST: 17 U/L (ref 15–41)
Albumin: 2.5 g/dL — ABNORMAL LOW (ref 3.5–5.0)
Alkaline Phosphatase: 78 U/L (ref 38–126)
Anion gap: 8 (ref 5–15)
BUN: 5 mg/dL — ABNORMAL LOW (ref 6–20)
CO2: 21 mmol/L — ABNORMAL LOW (ref 22–32)
Calcium: 8.7 mg/dL — ABNORMAL LOW (ref 8.9–10.3)
Chloride: 106 mmol/L (ref 98–111)
Creatinine, Ser: 0.57 mg/dL (ref 0.44–1.00)
GFR, Estimated: >60 mL/min (ref >60)
Glucose, Bld: 102 mg/dL — ABNORMAL HIGH (ref 70–99)
Potassium: 4 mmol/L (ref 3.5–5.1)
Sodium: 135 mmol/L (ref 135–145)
Total Bilirubin: 0.3 mg/dL (ref 0.0–1.2)
Total Protein: 6.3 g/dL — ABNORMAL LOW (ref 6.5–8.1)

## 2023-10-30 LAB — URINALYSIS, ROUTINE W REFLEX MICROSCOPIC
Bilirubin Urine: NEGATIVE
Glucose, UA: NEGATIVE mg/dL
Hgb urine dipstick: NEGATIVE
Ketones, ur: NEGATIVE mg/dL
Leukocytes,Ua: NEGATIVE
Nitrite: NEGATIVE
Protein, ur: NEGATIVE mg/dL
Specific Gravity, Urine: 1.01 (ref 1.005–1.030)
pH: 7 (ref 5.0–8.0)

## 2023-10-30 LAB — TYPE AND SCREEN
ABO/RH(D): O POS
Antibody Screen: NEGATIVE

## 2023-10-30 LAB — PROTEIN / CREATININE RATIO, URINE
Creatinine, Urine: 58 mg/dL
Protein Creatinine Ratio: 0.14 mg/mg{creat} (ref 0.00–0.15)
Total Protein, Urine: 8 mg/dL

## 2023-10-30 LAB — CBC
HCT: 30.4 % — ABNORMAL LOW (ref 36.0–46.0)
Hemoglobin: 9.5 g/dL — ABNORMAL LOW (ref 12.0–15.0)
MCH: 24.7 pg — ABNORMAL LOW (ref 26.0–34.0)
MCHC: 31.3 g/dL (ref 30.0–36.0)
MCV: 79.2 fL — ABNORMAL LOW (ref 80.0–100.0)
Platelets: 341 10*3/uL (ref 150–400)
RBC: 3.84 MIL/uL — ABNORMAL LOW (ref 3.87–5.11)
RDW: 15 % (ref 11.5–15.5)
WBC: 7.3 10*3/uL (ref 4.0–10.5)
nRBC: 0.5 % — ABNORMAL HIGH (ref 0.0–0.2)

## 2023-10-30 MED ORDER — LACTATED RINGERS IV SOLN: INTRAVENOUS | Status: DC

## 2023-10-30 MED ORDER — SOD CITRATE-CITRIC ACID 500-334 MG/5ML PO SOLN: 30.0000 mL | ORAL | Status: DC | PRN

## 2023-10-30 MED ORDER — FENTANYL-BUPIVACAINE-NACL 0.5-0.125-0.9 MG/250ML-% EP SOLN
EPIDURAL | Status: AC
  Filled 2023-10-30: qty 250

## 2023-10-30 MED ORDER — EPHEDRINE 5 MG/ML INJ: 10.0000 mg | INTRAVENOUS | Status: DC | PRN

## 2023-10-30 MED ORDER — OXYTOCIN BOLUS FROM INFUSION
333.0000 mL | Freq: Once | INTRAVENOUS | Status: AC
  Administered 2023-10-31: 333 mL via INTRAVENOUS

## 2023-10-30 MED ORDER — ONDANSETRON HCL 4 MG/2ML IJ SOLN
4.0000 mg | Freq: Four times a day (QID) | INTRAMUSCULAR | Status: DC | PRN
  Administered 2023-10-31 (×2): 4 mg via INTRAVENOUS
  Filled 2023-10-30 (×2): qty 2

## 2023-10-30 MED ORDER — HYDRALAZINE HCL 20 MG/ML IJ SOLN: 10.0000 mg | INTRAMUSCULAR | Status: DC | PRN

## 2023-10-30 MED ORDER — MAGNESIUM SULFATE BOLUS VIA INFUSION
4.0000 g | Freq: Once | INTRAVENOUS | Status: AC
  Administered 2023-10-30: 4 g via INTRAVENOUS
  Filled 2023-10-30: qty 1000

## 2023-10-30 MED ORDER — LABETALOL HCL 5 MG/ML IV SOLN: 20.0000 mg | INTRAVENOUS | Status: DC | PRN

## 2023-10-30 MED ORDER — MAGNESIUM SULFATE 40 GM/1000ML IV SOLN
2.0000 g/h | INTRAVENOUS | Status: DC
  Filled 2023-10-30: qty 1000

## 2023-10-30 MED ORDER — OXYCODONE-ACETAMINOPHEN 5-325 MG PO TABS: 1.0000 | ORAL_TABLET | ORAL | Status: DC | PRN

## 2023-10-30 MED ORDER — LACTATED RINGERS IV SOLN
500.0000 mL | Freq: Once | INTRAVENOUS | Status: DC
Start: 2023-10-30 — End: 2023-10-30

## 2023-10-30 MED ORDER — LACTATED RINGERS IV SOLN
500.0000 mL | INTRAVENOUS | Status: DC | PRN
  Administered 2023-10-31: 250 mL via INTRAVENOUS

## 2023-10-30 MED ORDER — LABETALOL HCL 5 MG/ML IV SOLN: 80.0000 mg | INTRAVENOUS | Status: DC | PRN

## 2023-10-30 MED ORDER — ZOLPIDEM TARTRATE 5 MG PO TABS
5.0000 mg | ORAL_TABLET | Freq: Every evening | ORAL | Status: DC | PRN
  Filled 2023-10-30: qty 1

## 2023-10-30 MED ORDER — OXYTOCIN-SODIUM CHLORIDE 30-0.9 UT/500ML-% IV SOLN
2.5000 [IU]/h | INTRAVENOUS | Status: DC
  Administered 2023-10-31: 2.5 [IU]/h via INTRAVENOUS
  Filled 2023-10-30: qty 500

## 2023-10-30 MED ORDER — LABETALOL HCL 5 MG/ML IV SOLN: 40.0000 mg | INTRAVENOUS | Status: DC | PRN

## 2023-10-30 MED ORDER — FENTANYL CITRATE (PF) 100 MCG/2ML IJ SOLN: 50.0000 ug | INTRAMUSCULAR | Status: DC | PRN

## 2023-10-30 MED ORDER — OXYCODONE-ACETAMINOPHEN 5-325 MG PO TABS: 2.0000 | ORAL_TABLET | ORAL | Status: DC | PRN

## 2023-10-30 MED ORDER — ACETAMINOPHEN 325 MG PO TABS: 650.0000 mg | ORAL_TABLET | ORAL | Status: DC | PRN

## 2023-10-30 MED ORDER — OXYTOCIN-SODIUM CHLORIDE 30-0.9 UT/500ML-% IV SOLN
1.0000 m[IU]/min | INTRAVENOUS | Status: DC
  Administered 2023-10-30: 2 m[IU]/min via INTRAVENOUS
  Filled 2023-10-30: qty 500

## 2023-10-30 MED ORDER — PHENYLEPHRINE 80 MCG/ML (10ML) SYRINGE FOR IV PUSH (FOR BLOOD PRESSURE SUPPORT): 80.0000 ug | PREFILLED_SYRINGE | INTRAVENOUS | Status: DC | PRN

## 2023-10-30 MED ORDER — LIDOCAINE HCL (PF) 1 % IJ SOLN
INTRAMUSCULAR | Status: DC | PRN
  Administered 2023-10-30: 5 mL via EPIDURAL

## 2023-10-30 MED ORDER — LIDOCAINE HCL (PF) 1 % IJ SOLN: 30.0000 mL | INTRAMUSCULAR | Status: DC | PRN

## 2023-10-30 MED ORDER — FENTANYL-BUPIVACAINE-NACL 0.5-0.125-0.9 MG/250ML-% EP SOLN
EPIDURAL | Status: DC | PRN
Start: 2023-10-30 — End: 2023-10-31
  Administered 2023-10-30: 12 mL/h via EPIDURAL

## 2023-10-30 MED ORDER — TERBUTALINE SULFATE 1 MG/ML IJ SOLN: 0.2500 mg | Freq: Once | INTRAMUSCULAR | Status: DC | PRN

## 2023-10-30 NOTE — Progress Notes (Signed)
   PRENATAL VISIT NOTE  Subjective:  Deborah Wells is a 28 y.o. G2P1001 at [redacted]w[redacted]d being seen today for ongoing prenatal care.  She is currently monitored for the following issues for this high-risk pregnancy and has gestational hypertension, S/P emergency cesarean section for fetal bradycardia; History of severe preeclampsia in prior pregnancy; History of cesarean section; Frequent headaches; and Supervision of high risk pregnancy, antepartum on their problem list.  Patient reports  worsening headaches, not ameliorated on Excedrin Tension and Flexeril .  Is worried given history of severe preeclampsia last pregnancy, also had headaches. No visual symptoms, but reports RUQ pain. Was evaluated in   Contractions: Irregular. Vag. Bleeding: None.  Movement: Present. Denies leaking of fluid.   The following portions of the patient's history were reviewed and updated as appropriate: allergies, current medications, past family history, past medical history, past social history, past surgical history and problem list.   Objective:   Vitals:   10/30/23 1452  BP: (!) 144/96  Pulse: (!) 103  Weight: 195 lb (88.5 kg)    Fetal Status: Fetal Heart Rate (bpm): 170   Movement: Present     General:  Alert, oriented and cooperative. Patient is in no acute distress.  Skin: Skin is warm and dry. No rash noted.   Cardiovascular: Normal heart rate noted  Respiratory: Normal respiratory effort, no problems with respiration noted  Abdomen: Soft, gravid, appropriate for gestational age.  Pain/Pressure: Absent     Pelvic: Cervical exam deferred        Extremities: Normal range of motion.  Edema: Trace  Mental Status: Normal mood and affect. Normal behavior. Normal judgment and thought content.   Assessment and Plan:  Pregnancy: G2P1001 at [redacted]w[redacted]d 1. Severe preeclampsia, third trimester (Primary) 2. S/P emergency cesarean section for fetal bradycardia 3. [redacted] weeks gestation of pregnancy 4. Supervision of  high risk pregnancy, antepartum Worried about severe preeclampsia because of severe headaches and known GHTN. Sent to hospital for delivery, [facility] L&D team notified.  Return for Postpartum check.  Future Appointments  Date Time Provider Department Center  11/06/2023 12:00 AM [facility] SCHED ROOM [facility] None    Lenoard Rad, MD

## 2023-10-30 NOTE — MAU Note (Signed)
..  Deborah Wells is a 28 y.o. at [redacted]w[redacted]d here in MAU reporting: was sent from office for elevated blood pressure. Takes labetelol for GHTN, last dose was 1300.  Reports headache for a few days, took Excedrin for it this morning but it did not help.  Epigastric pain for 2 days, reports it is sharp and comes and goes  Denies visual changes +FM. Reports irregular contractions. Denies vaginal bleeding or leaking of fluid.   Pain score: 7/10 headache; epigastric 8/10 Vitals:   10/30/23 1650  BP: (!) 145/92  Pulse: (!) 118  Resp: 20  Temp: 98.3 F (36.8 C)  SpO2: 99%     FHT:151 Lab orders placed from triage:  UA

## 2023-10-30 NOTE — MAU Note (Addendum)
 Dr. Cathyann Cobia called MAU charge nurse to inform MAU that patient was a direct admit.  This RN called LD charge nurse with report. Room assignment received. Tech transported pt to LD

## 2023-10-30 NOTE — Anesthesia Preprocedure Evaluation (Addendum)
 Anesthesia Evaluation  Patient identified by MRN, date of birth, ID band Patient awake    Reviewed: Allergy & Precautions, NPO status , Patient's Chart, lab work & pertinent test results  Airway Mallampati: II  TM Distance: >3 FB Neck ROM: Full    Dental no notable dental hx. (+) Teeth Intact, Dental Advisory Given   Pulmonary asthma    Pulmonary exam normal breath sounds clear to auscultation       Cardiovascular hypertension (prE on Mg++), Pt. on medications Normal cardiovascular exam Rhythm:Regular Rate:Normal     Neuro/Psych  Headaches  negative psych ROS   GI/Hepatic negative GI ROS, Neg liver ROS,,,  Endo/Other  negative endocrine ROS    Renal/GU      Musculoskeletal   Abdominal   Peds  Hematology  (+) Blood dyscrasia, anemia Lab Results      Component                Value               Date                      WBC                      7.3                 10/30/2023                HGB                      9.5 (L)             10/30/2023                HCT                      30.4 (L)            10/30/2023                MCV                      79.2 (L)            10/30/2023                PLT                      341                 10/30/2023              Anesthesia Other Findings   Reproductive/Obstetrics (+) Pregnancy                             Anesthesia Physical Anesthesia Plan  ASA: 3  Anesthesia Plan: Epidural   Post-op Pain Management:    Induction:   PONV Risk Score and Plan:   Airway Management Planned:   Additional Equipment:   Intra-op Plan:   Post-operative Plan:   Informed Consent: I have reviewed the patients History and Physical, chart, labs and discussed the procedure including the risks, benefits and alternatives for the proposed anesthesia with the patient or authorized representative who has indicated his/her understanding and acceptance.        Plan Discussed with:   Anesthesia Plan Comments: (36  wk G2P1 w prE on Mg ++, anemia for TOLAC w LEA)       Anesthesia Quick Evaluation

## 2023-10-30 NOTE — Progress Notes (Signed)
 Comfortable w/epidural.  Foley out.  Cx 4/60/-2.  AROM w/clear fluid. Pitocin at 10 mu/min w/mild and irregular ctx.  Patient Vitals for the past 4 hrs:  BP Temp Temp src Pulse Resp SpO2  10/30/23 2341 -- 98 F (36.7 C) Oral -- -- --  10/30/23 2330 (!) 145/81 -- -- 88 -- --  10/30/23 2320 129/83 -- -- 86 -- --  10/30/23 2315 105/82 -- -- 87 -- 100 %  10/30/23 2310 (!) 162/90 -- -- 84 -- 100 %  10/30/23 2306 (!) 153/86 -- -- 83 -- --  10/30/23 2305 -- -- -- -- -- 100 %  10/30/23 2301 (!) 147/82 -- -- 83 -- --  10/30/23 2300 -- -- -- -- -- 100 %  10/30/23 2256 (!) 138/99 -- -- 90 -- --  10/30/23 2255 -- -- -- -- -- 100 %  10/30/23 2251 (!) 151/88 -- -- 92 -- --  10/30/23 2247 (!) 154/86 -- -- (!) 103 -- 100 %  10/30/23 2130 (!) 142/90 -- -- 89 16 --  10/30/23 2039 130/86 -- -- 91 -- --   Mg at 2gm/hr. Increase pitocin until labor adequate.

## 2023-10-30 NOTE — MAU Note (Signed)

## 2023-10-30 NOTE — H&P (Signed)
 OBSTETRIC ADMISSION HISTORY AND PHYSICAL  Deborah Wells is a 28 y.o. female G2P1001 with IUP at [redacted]w[redacted]d by LMP presenting for IOL for preeclampsia (GHTN mild range BP with severe headache despite medication, hx of similar). She reports +FMs, No LOF, no VB, no blurry vision, headaches or peripheral edema, and RUQ pain.  She plans on breast feeding. She request depo-provera for birth control.  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  Past Medical History: Past Medical History:  Diagnosis Date   Abnormal cervical Papanicolaou smear    Asthma    Frequent headaches    Gonorrhea 01/24/2020   To get rocephin 7/9, POC________   Severe preeclampsia    Induced for this in 2017    Past Surgical History: Past Surgical History:  Procedure Laterality Date   CESAREAN SECTION N/A 06/21/2016   Procedure: CESAREAN SECTION;  Surgeon: Tereso Newcomer, MD;  Location: [facility];  Service: Obstetrics;  Laterality: N/A;   INSERTION OF NON VAGINAL CONTRACEPTIVE DEVICE  06/24/2016        Obstetrical History: OB History     Gravida  2   Para  1   Term  1   Preterm  0   AB  0   Living  1      SAB  0   IAB  0   Ectopic  0   Multiple  0   Live Births  1        Obstetric Comments  Gestational Hypertension w/first         Social History Social History   Socioeconomic History   Marital status: Single    Spouse name: Not on file   Number of children: Not on file   Years of education: Not on file   Highest education  level: Not on file  Occupational History   Not on file  Tobacco Use   Smoking status: Never   Smokeless tobacco: Never  Vaping Use   Vaping status: Never Used  Substance and Sexual Activity   Alcohol use: No   Drug use: Never   Sexual activity: Yes    Birth control/protection: None  Other Topics Concern   Not on file  Social History Narrative   ** Merged History Encounter **       Social Drivers of Health   Financial Resource Strain: Low Risk  (01/22/2020)   Overall Financial Resource Strain (CARDIA)    Difficulty of Paying Living Expenses: Not very hard  Food Insecurity: No Food Insecurity (10/30/2023)   Hunger Vital Sign    Worried About Running Out of Food in the Last Year: Never true    Ran Out of Food in the Last Year: Never true  Transportation Needs: No Transportation Needs (10/30/2023)   PRAPARE - Administrator, Civil Service (Medical): No    Lack of Transportation (Non-Medical): No  Recent Concern: Transportation Needs - Unmet Transportation Needs (10/23/2023)   PRAPARE - Transportation    Lack of Transportation (Medical): Yes    Lack  of Transportation (Non-Medical): Yes  Physical Activity: Insufficiently Active (01/22/2020)   Exercise Vital Sign    Days of Exercise per Week: 2 days    Minutes of Exercise per Session: 20 min  Stress: Not on file  Social Connections: Socially Isolated (01/22/2020)   Social Connection and Isolation Panel [NHANES]    Frequency of Communication with Friends and Family: Once a week    Frequency of Social Gatherings with Friends and Family: Never    Attends Religious Services: More than 4 times per year    Active Member of [employer] or Organizations: No    Attends Engineer, structural: Never    Marital Status: Never married    Family History: Family History  Problem Relation Age of Onset   Diabetes Mother    Cancer Maternal Grandmother    Breast cancer Maternal Grandmother    Asthma Neg Hx    Heart disease Neg Hx     Hypertension Neg Hx     Allergies: No Known Allergies  Medications Prior to Admission  Medication Sig Dispense Refill Last Dose/Taking   acetaminophen-caffeine (EXCEDRIN TENSION HEADACHE) 500-65 MG TABS per tablet Take 2 tablets by mouth every 8 (eight) hours as needed.   10/30/2023 Morning   aspirin EC 81 MG tablet Take 1 tablet (81 mg total) by mouth daily. Start taking when you are [redacted] weeks pregnant for rest of pregnancy for prevention of preeclampsia 300 tablet 2 10/30/2023   labetalol (NORMODYNE) 100 MG tablet Take 1 tablet (100 mg total) by mouth 2 (two) times daily. 60 tablet 1 10/30/2023 at  1:00 PM   Blood Pressure Monitoring (BLOOD PRESSURE KIT) DEVI 1 Device by Does not apply route once a week. 1 each 0    cyclobenzaprine (FLEXERIL) 10 MG tablet Take 1 tablet (10 mg total) by mouth 3 (three) times daily as needed for muscle spasms (or headache). (Patient not taking: Reported on 10/30/2023) 30 tablet 1    metoCLOPramide (REGLAN) 10 MG tablet Take 1 tablet (10 mg total) by mouth 4 (four) times daily as needed for nausea or vomiting (or headache). (Patient not taking: Reported on 10/30/2023) 30 tablet 0    pantoprazole (PROTONIX) 20 MG tablet Take 1 tablet (20 mg total) by mouth daily. (Patient not taking: Reported on 10/23/2023) 30 tablet 1    Prenatal MV & Min w/FA-DHA (PRENATAL ADULT GUMMY/DHA/FA) 0.4-25 MG CHEW Take one pill daily 90 tablet 3    prochlorperazine (COMPAZINE) 10 MG tablet Take 1 tablet (10 mg total) by mouth every 6 (six) hours as needed for nausea (headache). (Patient not taking: Reported on 10/30/2023) 30 tablet 3    terconazole (TERAZOL 3) 0.8 % vaginal cream Place 1 applicator vaginally at bedtime. (Patient not taking: Reported on 10/30/2023) 20 g 0      Review of Systems   All systems reviewed and negative except as stated in HPI  Blood pressure (!) 145/92, pulse (!) 118, temperature 98.3 F (36.8 C), resp. rate 20, last menstrual period 02/20/2023, SpO2  99%. General appearance: alert, cooperative, appears stated age, and moderate distress Lungs: clear to auscultation bilaterally Heart: regular rate and rhythm Abdomen: soft, non-tender; bowel sounds normal Pelvic: adequate, unproven Extremities: Homans sign is negative, no sign of DVT DTR's 2+ Presentation: cephalic Fetal monitoringBaseline: 140 bpm, Variability: Fair (1-6 bpm), Accelerations: Non-reactive but appropriate for gestational age, and Decelerations: Absent Uterine activityFrequency: Every 2-5 minutes Exam by:: Gae Jointer, RN (BSUS)   Prenatal labs: ABO, Rh: O/Positive/-- (10/23 1359)  Antibody: Negative (10/23 1359) Rubella: 1.12 (10/23 1359) RPR: Non Reactive (03/14 0823)  HBsAg: Negative (10/23 1359)  HIV: Non Reactive (03/14 0823)  GBS: Negative/-- (04/07 0848)    Lab Results  Component Value Date   GBS Negative 10/23/2023   GTT abnormal Genetic screening  low risk female, SILENT CARRIER for Alpha-Thalassemia (aa/a-)  Anatomy US  absent nasal bone  Immunization History  Administered Date(s) Administered   Tdap 06/15/2016    Prenatal Transfer Tool  Maternal Diabetes: No Genetic Screening: Abnormal:  Results: Other: SILENT CARRIER for Alpha-Thalassemia (aa/a-)  Maternal Ultrasounds/Referrals: Absent nasal bone Fetal Ultrasounds or other Referrals:  None Maternal Substance Abuse:  No Significant Maternal Medications:  None Significant Maternal Lab Results: Group B Strep negative Number of Prenatal Visits:greater than 3 verified prenatal visits Maternal Vaccinations:Covid Other Comments:  None   No results found for this or any previous visit (from the past 24 hours).  Patient Active Problem List   Diagnosis Date Noted   Supervision of high risk pregnancy, antepartum 04/18/2023   Frequent headaches 01/01/2019   History of cesarean section 06/21/2016   History of severe preeclampsia in prior pregnancy 06/19/2016   S/P emergency cesarean section for  fetal bradycardia 01/21/2016    Assessment/Plan:  ELLEANNA MELLING is a 28 y.o. G2P1001 at [redacted]w[redacted]d here for IOL for severe preeclampsia (by unrelieved headache and mild range BP)  #Labor:Discussed options of foley balloon, AROM +/- pitocin given TOLAC.  #Pain: Discussed options; Nitrous, Fentanyl and/or Epidural #FWB: Cat I #GBS status:  negative #Feeding: Breastmilk  #Reproductive Life planning: Depo Provera #Circ:  not applicable  Darrow End, MD  10/30/2023, 5:25 PM

## 2023-10-30 NOTE — Progress Notes (Signed)
 Ctx are mild and rare.  Mg at 2gm/hour.  Patient Vitals for the past 4 hrs:  BP Temp Temp src Pulse Resp SpO2  10/30/23 2130 (!) 142/90 -- -- 89 16 --  10/30/23 2039 130/86 -- -- 91 -- --  10/30/23 1935 (!) 142/92 -- -- (!) 105 -- 99 %  10/30/23 1925 (!) 146/90 -- -- (!) 101 16 99 %  10/30/23 1852 (!) 145/98 98.4 F (36.9 C) Oral 100 16 --   FHR Cat 1.  Cx 1/50/-2.  Foley inserted and inflated w/50cc H20.  PItocin at 10 mu/min.  Will stay there until balloon falls out.

## 2023-10-30 NOTE — Anesthesia Procedure Notes (Signed)
 Epidural Patient location during procedure: OB Start time: 10/30/2023 10:37 PM End time: 10/30/2023 10:51 PM  Staffing Anesthesiologist: Rosalita Combe, MD Performed: anesthesiologist   Preanesthetic Checklist Completed: patient identified, IV checked, site marked, risks and benefits discussed, surgical consent, monitors and equipment checked, pre-op evaluation and timeout performed  Epidural Patient position: sitting Prep: DuraPrep and site prepped and draped Patient monitoring: continuous pulse ox and blood pressure Approach: midline Location: L3-L4 Injection technique: LOR air  Needle:  Needle type: Tuohy  Needle gauge: 17 G Needle length: 9 cm and 9 Needle insertion depth: 7 cm Catheter type: closed end flexible Catheter size: 19 Gauge Catheter at skin depth: 13 cm Test dose: negative  Assessment Events: blood not aspirated, no cerebrospinal fluid, injection not painful, no injection resistance, no paresthesia and negative IV test  Additional Notes Patient identified. Risks/Benefits/Options discussed with patient including but not limited to bleeding, infection, nerve damage, paralysis, failed block, incomplete pain control, headache, blood pressure changes, nausea, vomiting, reactions to medication both or allergic, itching and postpartum back pain. Confirmed with bedside nurse the patient's most recent platelet count. Confirmed with patient that they are not currently taking any anticoagulation, have any bleeding history or any family history of bleeding disorders. Patient expressed understanding and wished to proceed. All questions were answered. Sterile technique was used throughout the entire procedure. Please see nursing notes for vital signs. Test dose was given through epidural needle and negative prior to continuing to dose epidural or start infusion. Warning signs of high block given to the patient including shortness of breath, tingling/numbness in hands, complete  motor block, or any concerning symptoms with instructions to call for help. Patient was given instructions on fall risk and not to get out of bed. All questions and concerns addressed with instructions to call with any issues.  1 Attempt (S) . Patient tolerated procedure well.

## 2023-10-31 ENCOUNTER — Encounter (HOSPITAL_COMMUNITY): Payer: Self-pay | Admitting: Obstetrics & Gynecology

## 2023-10-31 DIAGNOSIS — O34219 Maternal care for unspecified type scar from previous cesarean delivery: Principal | ICD-10-CM | POA: Insufficient documentation

## 2023-10-31 DIAGNOSIS — O1414 Severe pre-eclampsia complicating childbirth: Secondary | ICD-10-CM | POA: Diagnosis not present

## 2023-10-31 DIAGNOSIS — O34211 Maternal care for low transverse scar from previous cesarean delivery: Secondary | ICD-10-CM | POA: Diagnosis not present

## 2023-10-31 DIAGNOSIS — Z3A36 36 weeks gestation of pregnancy: Secondary | ICD-10-CM | POA: Diagnosis not present

## 2023-10-31 LAB — CBC
HCT: 28.1 % — ABNORMAL LOW (ref 36.0–46.0)
Hemoglobin: 9 g/dL — ABNORMAL LOW (ref 12.0–15.0)
MCH: 24.9 pg — ABNORMAL LOW (ref 26.0–34.0)
MCHC: 32 g/dL (ref 30.0–36.0)
MCV: 77.8 fL — ABNORMAL LOW (ref 80.0–100.0)
Platelets: 311 10*3/uL (ref 150–400)
RBC: 3.61 MIL/uL — ABNORMAL LOW (ref 3.87–5.11)
RDW: 15.1 % (ref 11.5–15.5)
WBC: 14.9 10*3/uL — ABNORMAL HIGH (ref 4.0–10.5)
nRBC: 0.1 % (ref 0.0–0.2)

## 2023-10-31 LAB — RPR: RPR Ser Ql: NONREACTIVE

## 2023-10-31 MED ORDER — SENNOSIDES-DOCUSATE SODIUM 8.6-50 MG PO TABS
2.0000 | ORAL_TABLET | Freq: Every day | ORAL | Status: DC
  Administered 2023-11-01 – 2023-11-02 (×2): 2 via ORAL
  Filled 2023-10-31 (×2): qty 2

## 2023-10-31 MED ORDER — IBUPROFEN 600 MG PO TABS
600.0000 mg | ORAL_TABLET | Freq: Four times a day (QID) | ORAL | Status: DC
  Administered 2023-10-31 – 2023-11-02 (×8): 600 mg via ORAL
  Filled 2023-10-31 (×9): qty 1

## 2023-10-31 MED ORDER — PHENYLEPHRINE 80 MCG/ML (10ML) SYRINGE FOR IV PUSH (FOR BLOOD PRESSURE SUPPORT): 80.0000 ug | PREFILLED_SYRINGE | INTRAVENOUS | Status: DC | PRN

## 2023-10-31 MED ORDER — LACTATED RINGERS AMNIOINFUSION
INTRAVENOUS | Status: DC
  Filled 2023-10-31 (×3): qty 1000

## 2023-10-31 MED ORDER — POTASSIUM CHLORIDE CRYS ER 20 MEQ PO TBCR
40.0000 meq | EXTENDED_RELEASE_TABLET | Freq: Every day | ORAL | Status: DC
  Administered 2023-11-02: 40 meq via ORAL
  Filled 2023-10-31 (×2): qty 2

## 2023-10-31 MED ORDER — DIPHENHYDRAMINE HCL 50 MG/ML IJ SOLN: 12.5000 mg | INTRAMUSCULAR | Status: DC | PRN

## 2023-10-31 MED ORDER — ONDANSETRON HCL 4 MG/2ML IJ SOLN: 4.0000 mg | INTRAMUSCULAR | Status: DC | PRN

## 2023-10-31 MED ORDER — COCONUT OIL OIL: 1.0000 | TOPICAL_OIL | Status: DC | PRN

## 2023-10-31 MED ORDER — ZOLPIDEM TARTRATE 5 MG PO TABS: 5.0000 mg | ORAL_TABLET | Freq: Every evening | ORAL | Status: DC | PRN

## 2023-10-31 MED ORDER — LACTATED RINGERS IV SOLN: INTRAVENOUS | Status: AC

## 2023-10-31 MED ORDER — TETANUS-DIPHTH-ACELL PERTUSSIS 5-2.5-18.5 LF-MCG/0.5 IM SUSY: 0.5000 mL | PREFILLED_SYRINGE | Freq: Once | INTRAMUSCULAR | Status: DC

## 2023-10-31 MED ORDER — PRENATAL MULTIVITAMIN CH
1.0000 | ORAL_TABLET | Freq: Every day | ORAL | Status: DC
  Administered 2023-11-01 – 2023-11-02 (×2): 1 via ORAL
  Filled 2023-10-31 (×2): qty 1

## 2023-10-31 MED ORDER — EPHEDRINE 5 MG/ML INJ: 10.0000 mg | INTRAVENOUS | Status: DC | PRN

## 2023-10-31 MED ORDER — SIMETHICONE 80 MG PO CHEW: 80.0000 mg | CHEWABLE_TABLET | ORAL | Status: DC | PRN

## 2023-10-31 MED ORDER — ACETAMINOPHEN 325 MG PO TABS
650.0000 mg | ORAL_TABLET | ORAL | Status: DC | PRN
  Administered 2023-10-31: 650 mg via ORAL
  Filled 2023-10-31 (×2): qty 2

## 2023-10-31 MED ORDER — LABETALOL HCL 100 MG PO TABS
100.0000 mg | ORAL_TABLET | Freq: Two times a day (BID) | ORAL | Status: DC
  Administered 2023-10-31 – 2023-11-01 (×3): 100 mg via ORAL
  Filled 2023-10-31 (×3): qty 1

## 2023-10-31 MED ORDER — ONDANSETRON HCL 4 MG PO TABS: 4.0000 mg | ORAL_TABLET | ORAL | Status: DC | PRN

## 2023-10-31 MED ORDER — FUROSEMIDE 40 MG PO TABS
40.0000 mg | ORAL_TABLET | Freq: Every day | ORAL | Status: DC
Start: 2023-10-31 — End: 2023-11-05
  Administered 2023-10-31 – 2023-11-02 (×2): 40 mg via ORAL
  Filled 2023-10-31 (×2): qty 1

## 2023-10-31 MED ORDER — MAGNESIUM SULFATE 40 GM/1000ML IV SOLN
2.0000 g/h | INTRAVENOUS | Status: AC
  Administered 2023-11-01: 2 g/h via INTRAVENOUS
  Filled 2023-10-31: qty 1000

## 2023-10-31 MED ORDER — BENZOCAINE-MENTHOL 20-0.5 % EX AERO: 1.0000 | INHALATION_SPRAY | CUTANEOUS | Status: DC | PRN

## 2023-10-31 MED ORDER — LACTATED RINGERS IV SOLN: 500.0000 mL | Freq: Once | INTRAVENOUS | Status: DC

## 2023-10-31 MED ORDER — ACETAMINOPHEN-CAFFEINE 500-65 MG PO TABS: 2.0000 | ORAL_TABLET | Freq: Three times a day (TID) | ORAL | Status: DC | PRN

## 2023-10-31 MED ORDER — FENTANYL-BUPIVACAINE-NACL 0.5-0.125-0.9 MG/250ML-% EP SOLN
12.0000 mL/h | EPIDURAL | Status: DC | PRN
  Administered 2023-10-31: 12 mL/h via EPIDURAL
  Filled 2023-10-31: qty 250

## 2023-10-31 MED ORDER — DIBUCAINE (PERIANAL) 1 % EX OINT: 1.0000 | TOPICAL_OINTMENT | CUTANEOUS | Status: DC | PRN

## 2023-10-31 MED ORDER — TRANEXAMIC ACID-NACL 1000-0.7 MG/100ML-% IV SOLN
INTRAVENOUS | Status: AC
Start: 2023-10-31 — End: 2023-11-01
  Filled 2023-10-31: qty 100

## 2023-10-31 MED ORDER — TRANEXAMIC ACID-NACL 1000-0.7 MG/100ML-% IV SOLN
1000.0000 mg | INTRAVENOUS | Status: AC
  Administered 2023-10-31: 1000 mg via INTRAVENOUS

## 2023-10-31 MED ORDER — DIPHENHYDRAMINE HCL 25 MG PO CAPS: 25.0000 mg | ORAL_CAPSULE | Freq: Four times a day (QID) | ORAL | Status: DC | PRN

## 2023-10-31 MED ORDER — CYCLOBENZAPRINE HCL 10 MG PO TABS: 10.0000 mg | ORAL_TABLET | Freq: Three times a day (TID) | ORAL | Status: DC | PRN

## 2023-10-31 MED ORDER — WITCH HAZEL-GLYCERIN EX PADS: 1.0000 | MEDICATED_PAD | CUTANEOUS | Status: DC | PRN

## 2023-10-31 NOTE — Lactation Note (Signed)
 This note was copied from a baby's chart. Lactation Consultation Note  Patient Name: Deborah Wells [id number] Date: 10/31/2023 Age:27 hours, 2nd LC visit  Reason for consult: Follow-up assessment;1st time breastfeeding;Late-preterm 34-36.6wks;Infant < 6lbs;Breastfeeding assistance (2nd LC visit for a feeding assessment and DEBP set up) Baby due to feed, LC checked and changed a large wet diaper, ( Mom aware ).  Baby STS for latch on the right breast with flanged lips and wide open mouth . Swallows noted and increased with breast compressions.  At 10 mins mom asked for the baby to be released and nipple appeared well rounded. LC reviewed pace feeding with the standard white nipple and mom aware of the supplementing guidelines for her LPT baby.  LC plan:  Feed with cues and by 3 hours,  Offer the breast 1st for 15 - 20 mins max and then supplement with EBM or formula according to the supplement guidelines for gm weight.  Post pump both breast for 15 mins and save milk for the next  feeding.  Maternal Data    Feeding Mother's Current Feeding Choice: Breast Milk and Formula Nipple Type: Nfant Standard Flow (white)  LATCH Score Latch: Repeated attempts needed to sustain latch, nipple held in mouth throughout feeding, stimulation needed to elicit sucking reflex.  Audible Swallowing: Spontaneous and intermittent  Type of Nipple: Everted at rest and after stimulation  Comfort (Breast/Nipple): Soft / non-tender  Hold (Positioning): Assistance needed to correctly position infant at breast and maintain latch.  LATCH Score: 8   Lactation Tools Discussed/Used Tools: Pump;Flanges (LC checked the flange and per mom comfortable, Will plan to pump after the feeding.) Flange Size: 21 Breast pump type: Double-Electric Breast Pump Pump Education: Setup, frequency, and cleaning;Milk Storage Reason for Pumping: LPT , Less than 6 pounds, mom on Mag So4  Interventions Interventions: Breast  feeding basics reviewed;Assisted with latch;Skin to skin;Breast massage;Hand express;Reverse pressure;Breast compression;Adjust position;Support pillows;Position options;DEBP;Education;Pace feeding;LC Services brochure;CDC milk storage guidelines;CDC Guidelines for Breast Pump Cleaning  Discharge Pump: Personal;Hands Free  Consult Status Consult Status: Follow-up Date: 11/01/23 Follow-up type: In-patient    Renda Carpen Remedy Corporan 10/31/2023, 6:24 PM

## 2023-10-31 NOTE — Progress Notes (Signed)
 Labor progress note:  Comfortable at bedside. FHR noted to be in 160s-170s, having variables w contractions -- suspect nuchal cord. Pt afebrile, not tachycardic. Will cont Pitocin, starting IUPC to see if this helps w variables. BP mild to moderate range, no severe range BP. Continue Mag.   Melanie Spires, MD OB Fellow, Faculty Practice [facility], Center for [facility]

## 2023-10-31 NOTE — Discharge Summary (Signed)
 Postpartum Discharge Summary  Patient Name: Deborah Wells DOB: 04-26-1996 MRN: [id number]  Date of admission: 10/30/2023 Delivery date:10/31/2023 Delivering provider: Melanie Spires Date of discharge: 11/02/2023  Admitting diagnosis: Pregnancy at 36/0. IOL for severe pre-eclampsia (GHTN with HAs). History of c-section.   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 yo [id number] at [redacted]w[redacted]d was admitted to the hospital 10/30/2023 for induction of labor.  Indication for induction: Preeclampsia.  Patient had an labor course complicated by nothing Membrane Rupture Time/Date: 11:43 PM,10/30/2023  Delivery Method:VBAC, Spontaneous Operative Delivery:N/A Episiotomy: None Lacerations:   Rt hemostatic peri-urethral abrasion (not repaired) Details of delivery can be found in separate delivery note.  Patient had a postpartum course that was uncomplicated.  She received 24h of PP Mg and was started on lasix , kdur and labetalol  and did well on this. She elected for depo provera  which was given prior to discharge on 11/02/23  Newborn Data: Birth date:10/31/2023 Birth time:12:28 PM Gender:Female Living status:Living Apgars:7 ,9  Weight:2430 g  Magnesium  Sulfate received: Yes: Seizure prophylaxis BMZ received: No Rhophylac:N/A MMR:N/A Flu: No RSV Vaccine received: No Transfusion:No  Immunizations received: Immunization History  Administered Date(s) Administered   Tdap 06/15/2016    Physical exam  Vitals:   11/01/23 1553 11/01/23 1922 11/02/23 0006 11/02/23 0425  BP: (!) 106/58 (!) 141/93 (!) 124/99 (!) 120/54  Pulse: (!) 102 (!) 104 (!) 107 (!) 102   Resp: 17 18 18 19   Temp: (!) 97.5 F (36.4 C) 98 F (36.7 C) 97.9 F (36.6 C) 97.7 F (36.5 C)  TempSrc: Oral Oral Oral Oral  SpO2: 97% 98% 98% 99%   General: alert Lochia: appropriate Uterine Fundus: firm, nttp Incision: N/A DVT Evaluation: No evidence of DVT seen on physical exam. Labs: Lab Results  Component Value Date   WBC 15.9 (H) 11/01/2023   HGB 7.9 (L) 11/01/2023   HCT 24.8 (L) 11/01/2023   MCV 78.7 (L) 11/01/2023   PLT 300 11/01/2023      Latest Ref Rng & Units 11/01/2023    4:22 AM  CMP  Glucose 70 - 99 mg/dL 147   BUN 6 - 20 mg/dL 7   Creatinine 8.29 - 5.62 mg/dL 1.30   Sodium 865 - 784 mmol/L 133   Potassium 3.5 - 5.1 mmol/L 4.2   Chloride 98 - 111 mmol/L 104   CO2 22 - 32 mmol/L 21   Calcium 8.9 - 10.3 mg/dL 8.3   Total Protein 6.5 - 8.1 g/dL 5.3   Total Bilirubin 0.0 - 1.2 mg/dL 0.4   Alkaline Phos 38 - 126 U/L 65   AST 15 - 41 U/L 17   ALT 0 - 44 U/L 9    Edinburgh Score:     No data to display         No data recorded  After visit meds:  Allergies as of 11/02/2023   No Known Allergies      Medication List  STOP taking these medications    aspirin  EC 81 MG tablet   cyclobenzaprine  10 MG tablet Commonly known as: FLEXERIL    prochlorperazine  10 MG tablet Commonly known as: COMPAZINE        TAKE these medications    acetaminophen -caffeine  500-65 MG Tabs per tablet Commonly known as: EXCEDRIN  TENSION HEADACHE Take 2 tablets by mouth every 8 (eight) hours as needed.   Blood Pressure Kit Devi 1 Device by Does not apply route once a week.   ferrous sulfate  325 (65 FE) MG tablet Take 1 tablet (325 mg total) by mouth every other day for 30 doses. Start taking on: November 03, 2023   furosemide  20 MG tablet Commonly known as: LASIX  Take 1 tablet (20 mg total) by mouth daily for 3 days.   ibuprofen  600 MG tablet Commonly known as: ADVIL  Take 1 tablet (600 mg total) by mouth every 6 (six) hours as needed.   labetalol  200  MG tablet Commonly known as: NORMODYNE  Take 1 tablet (200 mg total) by mouth 2 (two) times daily. What changed:  medication strength how much to take   metoCLOPramide  10 MG tablet Commonly known as: REGLAN  Take 1 tablet (10 mg total) by mouth 4 (four) times daily as needed for nausea or vomiting (or headache).   pantoprazole  20 MG tablet Commonly known as: Protonix  Take 1 tablet (20 mg total) by mouth daily.   polyethylene glycol 17 g packet Commonly known as: MiraLax  Take 17 g by mouth daily.   potassium chloride  SA 20 MEQ tablet Commonly known as: KLOR-CON  M Take 1 tablet (20 mEq total) by mouth daily for 3 doses.   Prenatal Adult Gummy/DHA/FA 0.4-25 MG Chew Take one pill daily   simethicone  80 MG chewable tablet Commonly known as: MYLICON Chew 1 tablet (80 mg total) by mouth as needed for flatulence.   terconazole  0.8 % vaginal cream Commonly known as: TERAZOL 3  Place 1 applicator vaginally at bedtime.         Discharge home in stable condition Infant Feeding: Breast Infant Disposition:home with mother Discharge instruction: per After Visit Summary and Postpartum booklet. Activity: Advance as tolerated. Pelvic rest for 6 weeks.  Diet: routine diet Future Appointments: Future Appointments  Date Time Provider Department Center  11/08/2023 11:00 AM [facility] NURSE [facility] None  12/14/2023  3:10 PM Izell Marsh, MD [facility] None   Follow up Visit:  Follow-up Information     [facility] Go on 11/08/2023.   Contact information: [address] [place]  [address] [phone]               Message sent to [facility] 4/15  Please schedule this patient for a In person postpartum visit in 6 weeks with the following provider: Any provider. Additional Postpartum F/U:BP check 1 week  High risk pregnancy complicated by: HTN Delivery mode:  VBAC, Spontaneous Anticipated Birth Control:  PP Depo  given   11/02/2023 Raynell Caller, MD

## 2023-10-31 NOTE — Progress Notes (Signed)
 Patient Vitals for the past 4 hrs:  BP Temp Temp src Pulse Resp  10/31/23 0231 137/88 -- -- 91 --  10/31/23 0222 (!) 140/85 -- -- 86 --  10/31/23 0217 -- 98 F (36.7 C) Oral -- 16  10/31/23 0201 139/81 -- -- 93 --  10/31/23 0150 139/85 -- -- 86 16  10/31/23 0113 -- (!) 97.5 F (36.4 C) Oral -- --  10/31/23 0100 (!) 147/101 -- -- 84 --  10/31/23 0030 139/83 -- -- 87 --  10/31/23 0000 131/71 -- -- 90 --  10/30/23 2341 -- 98 F (36.7 C) Oral -- --   Comfortable w/epidural.  FHR Cat 1.  Ctx coupling, pitocin at 18 mu/min.  Cx 5/90/-1/asynclitic. IUPC placed.  Mg at 2gm/hr.  Position changes to facilitate alignment

## 2023-11-01 LAB — COMPREHENSIVE METABOLIC PANEL WITH GFR
ALT: 9 U/L (ref 0–44)
AST: 17 U/L (ref 15–41)
Albumin: 2 g/dL — ABNORMAL LOW (ref 3.5–5.0)
Alkaline Phosphatase: 65 U/L (ref 38–126)
Anion gap: 8 (ref 5–15)
BUN: 7 mg/dL (ref 6–20)
CO2: 21 mmol/L — ABNORMAL LOW (ref 22–32)
Calcium: 8.3 mg/dL — ABNORMAL LOW (ref 8.9–10.3)
Chloride: 104 mmol/L (ref 98–111)
Creatinine, Ser: 0.91 mg/dL (ref 0.44–1.00)
GFR, Estimated: >60 mL/min (ref >60)
Glucose, Bld: 136 mg/dL — ABNORMAL HIGH (ref 70–99)
Potassium: 4.2 mmol/L (ref 3.5–5.1)
Sodium: 133 mmol/L — ABNORMAL LOW (ref 135–145)
Total Bilirubin: 0.4 mg/dL (ref 0.0–1.2)
Total Protein: 5.3 g/dL — ABNORMAL LOW (ref 6.5–8.1)

## 2023-11-01 LAB — CBC
HCT: 24.8 % — ABNORMAL LOW (ref 36.0–46.0)
Hemoglobin: 7.9 g/dL — ABNORMAL LOW (ref 12.0–15.0)
MCH: 25.1 pg — ABNORMAL LOW (ref 26.0–34.0)
MCHC: 31.9 g/dL (ref 30.0–36.0)
MCV: 78.7 fL — ABNORMAL LOW (ref 80.0–100.0)
Platelets: 300 10*3/uL (ref 150–400)
RBC: 3.15 MIL/uL — ABNORMAL LOW (ref 3.87–5.11)
RDW: 15 % (ref 11.5–15.5)
WBC: 15.9 10*3/uL — ABNORMAL HIGH (ref 4.0–10.5)
nRBC: 0.2 % (ref 0.0–0.2)

## 2023-11-01 LAB — CERVICOVAGINAL ANCILLARY ONLY
Chlamydia: NEGATIVE
Comment: NEGATIVE
Comment: NEGATIVE
Comment: NORMAL
Neisseria Gonorrhea: NEGATIVE
Trichomonas: NEGATIVE

## 2023-11-01 MED ORDER — LABETALOL HCL 200 MG PO TABS
200.0000 mg | ORAL_TABLET | Freq: Two times a day (BID) | ORAL | Status: DC
  Administered 2023-11-01 – 2023-11-02 (×2): 200 mg via ORAL
  Filled 2023-11-01 (×2): qty 1

## 2023-11-01 MED ORDER — FERROUS SULFATE 325 (65 FE) MG PO TABS
325.0000 mg | ORAL_TABLET | ORAL | Status: DC
  Administered 2023-11-01: 325 mg via ORAL
  Filled 2023-11-01: qty 1

## 2023-11-01 NOTE — Progress Notes (Signed)
 POSTPARTUM PROGRESS NOTE  PPD #1  Subjective:  Deborah Wells is a 28 y.o. [id number] s/p VBAC at [redacted]w[redacted]d. Today she notes she is doing well. She denies any problems with ambulating, voiding or po intake. Denies nausea or vomiting. She has passed flatus, no BM.  Pain is well controlled.  Lochia minimal Denies fever/chills/chest pain/SOB.  no HA, no blurry vision, noRUQ pain  Objective: Blood pressure (!) 142/86, pulse (!) 101, temperature (!) 97.3 F (36.3 C), temperature source Oral, resp. rate 16, last menstrual period 02/20/2023, SpO2 99%, unknown if currently breastfeeding.  Physical Exam:  General: alert, cooperative and no distress Chest: no respiratory distress Heart: regular rate and rhythm Abdomen: soft, non-tender Uterine Fundus: firm, appropriately tender Incision: NA DVT Evaluation: No calf swelling or tenderness Extremities: no edema Skin: warm, dry  Results for orders placed or performed during the hospital encounter of 10/30/23 (from the past 24 hours)  CBC     Status: Abnormal   Collection Time: 10/31/23  1:58 PM  Result Value Ref Range   WBC 14.9 (H) 4.0 - 10.5 K/uL   RBC 3.61 (L) 3.87 - 5.11 MIL/uL   Hemoglobin 9.0 (L) 12.0 - 15.0 g/dL   HCT 14.7 (L) 82.9 - 56.2 %   MCV 77.8 (L) 80.0 - 100.0 fL   MCH 24.9 (L) 26.0 - 34.0 pg   MCHC 32.0 30.0 - 36.0 g/dL   RDW 13.0 86.5 - 78.4 %   Platelets 311 150 - 400 K/uL   nRBC 0.1 0.0 - 0.2 %  CBC     Status: Abnormal   Collection Time: 11/01/23  4:22 AM  Result Value Ref Range   WBC 15.9 (H) 4.0 - 10.5 K/uL   RBC 3.15 (L) 3.87 - 5.11 MIL/uL   Hemoglobin 7.9 (L) 12.0 - 15.0 g/dL   HCT 69.6 (L) 29.5 - 28.4 %   MCV 78.7 (L) 80.0 - 100.0 fL   MCH 25.1 (L) 26.0 - 34.0 pg   MCHC 31.9 30.0 - 36.0 g/dL   RDW 13.2 44.0 - 10.2 %   Platelets 300 150 - 400 K/uL   nRBC 0.2 0.0 - 0.2 %  Comprehensive metabolic panel     Status: Abnormal   Collection Time: 11/01/23  4:22 AM  Result Value Ref Range   Sodium 133 (L) 135 -  145 mmol/L   Potassium 4.2 3.5 - 5.1 mmol/L   Chloride 104 98 - 111 mmol/L   CO2 21 (L) 22 - 32 mmol/L   Glucose, Bld 136 (H) 70 - 99 mg/dL   BUN 7 6 - 20 mg/dL   Creatinine, Ser 7.25 0.44 - 1.00 mg/dL   Calcium 8.3 (L) 8.9 - 10.3 mg/dL   Total Protein 5.3 (L) 6.5 - 8.1 g/dL   Albumin 2.0 (L) 3.5 - 5.0 g/dL   AST 17 15 - 41 U/L   ALT 9 0 - 44 U/L   Alkaline Phosphatase 65 38 - 126 U/L   Total Bilirubin 0.4 0.0 - 1.2 mg/dL   GFR, Estimated >36 >64 mL/min   Anion gap 8 5 - 15    Assessment/Plan: Deborah Wells is a 27 y.o. [id number] s/p VBAC at [redacted]w[redacted]d PPD#1 complicated by: 1) Preeclampsia with severe features -currently on Mag, to be discontinued today @ 123pm -continue Lasix and K daily -increased Labetalol 200mg  bid, will continue to closely monitor BPs -slight increase in Cr, but still within normal range  2) Anemia in pregnancy -appropriate decline due to  recent delivery -pt asymptomatic, plan for oral iron   Contraception: Depot Feeding: breastfeeding  Dispo: Continue postpartum care as outlined above   LOS: 2 days   Chloe Baig, DO Faculty Attending, Center for [facility] 11/01/2023, 12:36 PM

## 2023-11-01 NOTE — Anesthesia Postprocedure Evaluation (Signed)
 Anesthesia Post Note  Patient: Deborah Wells  Procedure(s) Performed: AN AD HOC LABOR EPIDURAL     Patient location during evaluation: Mother Baby Anesthesia Type: Epidural Level of consciousness: awake and alert and oriented Pain management: satisfactory to patient Vital Signs Assessment: post-procedure vital signs reviewed and stable Respiratory status: respiratory function stable Cardiovascular status: stable Postop Assessment: no headache, no backache, epidural receding, patient able to bend at knees, no signs of nausea or vomiting, adequate PO intake and able to ambulate Anesthetic complications: no   No notable events documented.  Last Vitals:  Vitals:   11/01/23 0647 11/01/23 0808  BP:  (!) 142/86  Pulse:  (!) 101  Resp: 16 16  Temp:  (!) 36.3 C  SpO2:  99%    Last Pain:  Vitals:   11/01/23 0808  TempSrc: Oral  PainSc: 6    Pain Goal:                   Vishnu Moeller

## 2023-11-01 NOTE — Lactation Note (Signed)
 This note was copied from a baby's chart. Lactation Consultation Note  Patient Name: Girl Winfred Redel [id number] Date: 11/01/2023 Age:28 years  Reason for consult: Follow-up assessment;Late-preterm 34-36.6wks  P2, [redacted]w[redacted]d, 4.9% weight loss, <6 lbs  Mother was in the middle of a diaper change upon entry to room. Mother was very pleasant and states she is feeling better since the IV Mag has stopped. She says she has not pumped yet but plans to start now that she is feeling better. She says the [facility] who saw her yesterday was very informative.   Discussed pumping and skin to skin with latch attempts as baby appears interested. Mother's previous baby was in the NICU. She denies any questions or concerns.    Feeding Mother's Current Feeding Choice: Formula Nipple Type: Nfant Standard Flow (white)  Lactation Tools Discussed/Used Pumping frequency: encourgae every 3 hours to stimulate milk production  Interventions Interventions: Education  Discharge    Consult Status Consult Status: Follow-up Date: 11/02/23 Follow-up type: In-patient    Gearline Kell M 11/01/2023, 7:04 PM

## 2023-11-02 ENCOUNTER — Encounter: Payer: Self-pay | Admitting: Obstetrics & Gynecology

## 2023-11-02 ENCOUNTER — Other Ambulatory Visit (HOSPITAL_COMMUNITY): Payer: Self-pay

## 2023-11-02 MED ORDER — FERROUS SULFATE 325 (65 FE) MG PO TABS
325.0000 mg | ORAL_TABLET | ORAL | 0 refills | Status: AC
  Filled 2023-11-02: qty 30, 60d supply, fill #0

## 2023-11-02 MED ORDER — SIMETHICONE 80 MG PO CHEW
80.0000 mg | CHEWABLE_TABLET | ORAL | 0 refills | Status: AC | PRN
  Filled 2023-11-02: qty 30, 30d supply, fill #0

## 2023-11-02 MED ORDER — POTASSIUM CHLORIDE CRYS ER 20 MEQ PO TBCR
20.0000 meq | EXTENDED_RELEASE_TABLET | Freq: Every day | ORAL | 0 refills | Status: AC
  Filled 2023-11-02: qty 3, 3d supply, fill #0

## 2023-11-02 MED ORDER — LABETALOL HCL 200 MG PO TABS
200.0000 mg | ORAL_TABLET | Freq: Two times a day (BID) | ORAL | 0 refills | Status: AC
  Filled 2023-11-02: qty 84, 42d supply, fill #0

## 2023-11-02 MED ORDER — FUROSEMIDE 20 MG PO TABS
20.0000 mg | ORAL_TABLET | Freq: Every day | ORAL | 0 refills | Status: AC
  Filled 2023-11-02: qty 3, 3d supply, fill #0

## 2023-11-02 MED ORDER — POLYETHYLENE GLYCOL 3350 17 GM/SCOOP PO POWD
17.0000 g | Freq: Every day | ORAL | 0 refills | Status: AC
  Filled 2023-11-02: qty 238, 14d supply, fill #0

## 2023-11-02 MED ORDER — IBUPROFEN 600 MG PO TABS
600.0000 mg | ORAL_TABLET | Freq: Four times a day (QID) | ORAL | 0 refills | Status: AC | PRN
  Filled 2023-11-02: qty 30, 8d supply, fill #0

## 2023-11-02 MED ORDER — MEDROXYPROGESTERONE ACETATE 150 MG/ML IM SUSP
150.0000 mg | Freq: Once | INTRAMUSCULAR | Status: AC
  Administered 2023-11-02: 150 mg via INTRAMUSCULAR
  Filled 2023-11-02: qty 1

## 2023-11-02 NOTE — Discharge Instructions (Signed)
 Continue to check your blood pressures twice a day Call the office for blood pressures that are consistently above 145 for the top number or 95 for the bottom number   Hypertension During Pregnancy Hypertension is also called high blood pressure. High blood pressure means that the force of your blood moving in your body is too strong. It can cause problems for you and your baby. Different types of high blood pressure can happen during pregnancy. The types are: High blood pressure before you got pregnant. This is called chronic hypertension.  This can continue during your pregnancy. Your doctor will want to keep checking your blood pressure. You may need medicine to keep your blood pressure under control while you are pregnant. You will need follow-up visits after you have your baby. High blood pressure that goes up during pregnancy when it was normal before. This is called gestational hypertension. It will usually get better after you have your baby, but your doctor will need to watch your blood pressure to make sure that it is getting better. Very high blood pressure during pregnancy. This is called preeclampsia. Very high blood pressure is an emergency that needs to be checked and treated right away. You may develop very high blood pressure after giving birth. This is called postpartum preeclampsia. This usually occurs within 48 hours after childbirth but may occur up to 6 weeks after giving birth. This is rare. How does this affect me? If you have high blood pressure during pregnancy, you have a higher chance of developing high blood pressure: As you get older. If you get pregnant again. In some cases, high blood pressure during pregnancy can cause: Stroke. Heart attack. Damage to the kidneys, lungs, or liver. Preeclampsia. Jerky movements you cannot control (convulsions or seizures). Problems with the placenta.  What can I do to lower my risk?  Keep a healthy weight. Eat a healthy  diet. Follow what your doctor tells you about treating any medical problems that you had before becoming pregnant. It is very important to go to all of your doctor visits. Your doctor will check your blood pressure and make sure that your pregnancy is progressing as it should. Treatment should start early if a problem is found.  Follow these instructions at home:  Take your blood pressure 1-2 times per day. Call the office if your blood pressure is 155 or higher for the top number or 105 or higher for the bottom number.    Eating and drinking  Drink enough fluid to keep your pee (urine) pale yellow. Avoid caffeine. Lifestyle Do not use any products that contain nicotine or tobacco, such as cigarettes, e-cigarettes, and chewing tobacco. If you need help quitting, ask your doctor. Do not use alcohol or drugs. Avoid stress. Rest and get plenty of sleep. Regular exercise can help. Ask your doctor what kinds of exercise are best for you. General instructions Take over-the-counter and prescription medicines only as told by your doctor. Keep all prenatal and follow-up visits as told by your doctor. This is important. Contact a doctor if: You have symptoms that your doctor told you to watch for, such as: Headaches. Nausea. Vomiting. Belly (abdominal) pain. Dizziness. Light-headedness. Get help right away if: You have: Very bad belly pain that does not get better with treatment. A very bad headache that does not get better. Vomiting that does not get better. Sudden, fast weight gain. Sudden swelling in your hands, ankles, or face. Blood in your pee. Blurry vision. Double vision.  Shortness of breath. Chest pain. Weakness on one side of your body. Trouble talking. Summary High blood pressure is also called hypertension. High blood pressure means that the force of your blood moving in your body is too strong. High blood pressure can cause problems for you and your baby. Keep all  follow-up visits as told by your doctor. This is important. This information is not intended to replace advice given to you by your health care provider. Make sure you discuss any questions you have with your health care provider. Document Released: 08/06/2010 Document Revised: 10/25/2018 Document Reviewed: 07/31/2018 Elsevier Patient Education  2020 [employer].

## 2023-11-02 NOTE — Progress Notes (Signed)
 Discharge AVS instructions reviewed with patient, PP care, meds regimen, pre-e s/s reviewed, F/u appointments and infant care. Infant discharge instructions reviewed, including f/u appointment, when to call pediatrician: voids/BMs and feeds reviewed. Pt and FOB both verbalize understanding.

## 2023-11-02 NOTE — Plan of Care (Signed)
 Problem: Education: Goal: Knowledge of General Education information will improve Description: Including pain rating scale, medication(s)/side effects and non-pharmacologic comfort measures Outcome: Adequate for Discharge   Problem: Health Behavior/Discharge Planning: Goal: Ability to manage health-related needs will improve Outcome: Adequate for Discharge   Problem: Clinical Measurements: Goal: Ability to maintain clinical measurements within normal limits will improve Outcome: Adequate for Discharge Goal: Will remain free from infection Outcome: Adequate for Discharge Goal: Diagnostic test results will improve Outcome: Adequate for Discharge Goal: Respiratory complications will improve Outcome: Adequate for Discharge Goal: Cardiovascular complication will be avoided Outcome: Adequate for Discharge   Problem: Activity: Goal: Risk for activity intolerance will decrease Outcome: Adequate for Discharge   Problem: Nutrition: Goal: Adequate nutrition will be maintained Outcome: Adequate for Discharge   Problem: Coping: Goal: Level of anxiety will decrease Outcome: Adequate for Discharge   Problem: Elimination: Goal: Will not experience complications related to bowel motility Outcome: Adequate for Discharge Goal: Will not experience complications related to urinary retention Outcome: Adequate for Discharge   Problem: Pain Managment: Goal: General experience of comfort will improve and/or be controlled Outcome: Adequate for Discharge   Problem: Safety: Goal: Ability to remain free from injury will improve Outcome: Adequate for Discharge   Problem: Skin Integrity: Goal: Risk for impaired skin integrity will decrease Outcome: Adequate for Discharge   Problem: Education: Goal: Knowledge of disease or condition will improve Outcome: Adequate for Discharge Goal: Knowledge of the prescribed therapeutic regimen will improve Outcome: Adequate for Discharge   Problem:  Fluid Volume: Goal: Peripheral tissue perfusion will improve Outcome: Adequate for Discharge   Problem: Clinical Measurements: Goal: Complications related to disease process, condition or treatment will be avoided or minimized Outcome: Adequate for Discharge   Problem: Education: Goal: Knowledge of Childbirth will improve Outcome: Adequate for Discharge Goal: Ability to make informed decisions regarding treatment and plan of care will improve Outcome: Adequate for Discharge Goal: Ability to state and carry out methods to decrease the pain will improve Outcome: Adequate for Discharge Goal: Individualized Educational Video(s) Outcome: Adequate for Discharge   Problem: Coping: Goal: Ability to verbalize concerns and feelings about labor and delivery will improve Outcome: Adequate for Discharge   Problem: Life Cycle: Goal: Ability to make normal progression through stages of labor will improve Outcome: Adequate for Discharge Goal: Ability to effectively push during vaginal delivery will improve Outcome: Adequate for Discharge   Problem: Role Relationship: Goal: Will demonstrate positive interactions with the child Outcome: Adequate for Discharge   Problem: Safety: Goal: Risk of complications during labor and delivery will decrease Outcome: Adequate for Discharge   Problem: Pain Management: Goal: Relief or control of pain from uterine contractions will improve Outcome: Adequate for Discharge   Problem: Education: Goal: Knowledge of condition will improve Outcome: Adequate for Discharge Goal: Individualized Educational Video(s) Outcome: Adequate for Discharge Goal: Individualized Newborn Educational Video(s) Outcome: Adequate for Discharge   Problem: Activity: Goal: Will verbalize the importance of balancing activity with adequate rest periods Outcome: Adequate for Discharge Goal: Ability to tolerate increased activity will improve Outcome: Adequate for Discharge    Problem: Coping: Goal: Ability to identify and utilize available resources and services will improve Outcome: Adequate for Discharge   Problem: Life Cycle: Goal: Chance of risk for complications during the postpartum period will decrease Outcome: Adequate for Discharge   Problem: Role Relationship: Goal: Ability to demonstrate positive interaction with newborn will improve Outcome: Adequate for Discharge   Problem: Skin Integrity: Goal: Demonstration of wound healing without  infection will improve Outcome: Adequate for Discharge

## 2023-11-02 NOTE — Lactation Note (Signed)
 This note was copied from a baby's chart. Lactation Consultation Note  Patient Name: Deborah Wells NFAOZ'H Date: 11/02/2023 Age:27 hours Reason for consult: Follow-up assessment;Late-preterm 34-36.6wks  P2, 36 wks, @ 52 hrs of life. Per mom- waiting to see if baby will be DC today or tomorrow. Encouraged mom hormones of lactation get a faster/easier breast response with each baby.  Encouraged mom to keep milk moving- pumping every 3 hrs and use coconut oil with pumping. Discussed cluster feeding overnight/ early morning brings in our milk supply, shared expectations of milk coming in. Highlighted risk of engorgement. Discussed hand pump/express to soften breasts, motrin as anti-inflammatory, and ice packs for 10-20 minutes post feed/pumping if still over-full is the best treatments for inflamed/engorged breasts.   Feeding Mother's Current Feeding Choice: Breast Milk and Formula Nipple Type: Dr. Michelene Ahmadi Preemie  Interventions Interventions: Coconut oil;Expressed milk;Hand pump;DEBP;Education;LC Services brochure;CDC milk storage guidelines  Discharge Discharge Education: Engorgement and breast care Pump: Personal;Hands Free;Manual (Per mom has the mom cozy wearable pumps, demonstrated how pump kit parts create a hand pump- highlighted best for softening full/swollen/ engorged breasts.)  Consult Status Consult Status: Complete Date: 11/02/23 Follow-up type: In-patient    Jolena Kittle 11/02/2023, 4:33 PM

## 2023-11-02 NOTE — Plan of Care (Signed)
  Problem: Education: Goal: Knowledge of General Education information will improve Description: Including pain rating scale, medication(s)/side effects and non-pharmacologic comfort measures Outcome: Progressing   Problem: Health Behavior/Discharge Planning: Goal: Ability to manage health-related needs will improve Outcome: Progressing   Problem: Clinical Measurements: Goal: Ability to maintain clinical measurements within normal limits will improve Outcome: Progressing Goal: Will remain free from infection Outcome: Progressing Goal: Diagnostic test results will improve Outcome: Progressing Goal: Respiratory complications will improve Outcome: Progressing Goal: Cardiovascular complication will be avoided Outcome: Progressing   Problem: Activity: Goal: Risk for activity intolerance will decrease Outcome: Progressing   Problem: Nutrition: Goal: Adequate nutrition will be maintained Outcome: Progressing   Problem: Coping: Goal: Level of anxiety will decrease Outcome: Progressing   Problem: Elimination: Goal: Will not experience complications related to bowel motility Outcome: Progressing Goal: Will not experience complications related to urinary retention Outcome: Progressing   Problem: Pain Managment: Goal: General experience of comfort will improve and/or be controlled Outcome: Progressing   Problem: Safety: Goal: Ability to remain free from injury will improve Outcome: Progressing   Problem: Skin Integrity: Goal: Risk for impaired skin integrity will decrease Outcome: Progressing   Problem: Education: Goal: Knowledge of disease or condition will improve Outcome: Progressing Goal: Knowledge of the prescribed therapeutic regimen will improve Outcome: Progressing

## 2023-11-06 ENCOUNTER — Encounter (HOSPITAL_COMMUNITY): Payer: Self-pay

## 2023-11-06 ENCOUNTER — Inpatient Hospital Stay (HOSPITAL_COMMUNITY): Admission: RE | Admit: 2023-11-06 | Source: Home / Self Care | Admitting: Obstetrics & Gynecology

## 2023-11-06 ENCOUNTER — Inpatient Hospital Stay (HOSPITAL_COMMUNITY)

## 2023-11-08 ENCOUNTER — Telehealth (HOSPITAL_COMMUNITY): Payer: Self-pay | Admitting: *Deleted

## 2023-11-08 ENCOUNTER — Ambulatory Visit

## 2023-11-08 NOTE — Telephone Encounter (Signed)
 11/08/2023  Name: CALEB PRIGMORE MRN: [id number] DOB: October 24, 1995  Reason for Call:  Transition of Care Hospital Discharge Call  Contact Status: Patient Contact Status:  (incomplete)  Language assistant needed: Interpreter Mode: Interpreter Not Needed        Follow-Up Questions: Do You Have Any Concerns About Your Health As You Heal From Delivery?: No Do You Have Any Concerns About Your Infants Health?: No Pt said she has not been having any symptoms of high BP.  Also said that she has been checking her BPs at home and they have been normal.  Dimple Francis Postnatal Depression Scale:  In the Past 7 Days:    PHQ2-9 Depression Scale:     Discharge Follow-up: Edinburgh score requires follow up?:  (call dropped before getting to this, return call went to voice mail)  Post-discharge interventions: NA  Pearlie Bougie, RN 11/08/2023 14:21

## 2023-12-14 ENCOUNTER — Ambulatory Visit: Admitting: Obstetrics and Gynecology

## 2024-08-02 ENCOUNTER — Other Ambulatory Visit (HOSPITAL_COMMUNITY): Payer: Self-pay
